# Patient Record
Sex: Female | Born: 1970 | Race: White | Hispanic: No | Marital: Married | Smoking: Never smoker
Health system: Southern US, Community
[De-identification: ages and names within clinical notes are randomized; demographics above are authoritative.]

## PROBLEM LIST (undated history)

## (undated) DIAGNOSIS — B009 Herpesviral infection, unspecified: Secondary | ICD-10-CM

## (undated) DIAGNOSIS — E663 Overweight: Secondary | ICD-10-CM

## (undated) DIAGNOSIS — Z86018 Personal history of other benign neoplasm: Secondary | ICD-10-CM

## (undated) DIAGNOSIS — F909 Attention-deficit hyperactivity disorder, unspecified type: Secondary | ICD-10-CM

## (undated) DIAGNOSIS — R030 Elevated blood-pressure reading, without diagnosis of hypertension: Secondary | ICD-10-CM

## (undated) HISTORY — PX: OTHER SURGICAL HISTORY: SHX169

## (undated) HISTORY — DX: Personal history of other benign neoplasm: Z86.018

## (undated) HISTORY — DX: Overweight: E66.3

## (undated) HISTORY — DX: Herpesviral infection, unspecified: B00.9

## (undated) HISTORY — PX: ABLATION: SHX5711

## (undated) HISTORY — DX: Elevated blood-pressure reading, without diagnosis of hypertension: R03.0

## (undated) HISTORY — DX: Attention-deficit hyperactivity disorder, unspecified type: F90.9

---

## 1998-08-02 ENCOUNTER — Other Ambulatory Visit: Admission: RE | Admit: 1998-08-02 | Discharge: 1998-08-02 | Payer: Self-pay | Admitting: Obstetrics and Gynecology

## 1999-02-22 ENCOUNTER — Other Ambulatory Visit: Admission: RE | Admit: 1999-02-22 | Discharge: 1999-02-22 | Payer: Self-pay | Admitting: Obstetrics and Gynecology

## 2000-02-26 ENCOUNTER — Other Ambulatory Visit: Admission: RE | Admit: 2000-02-26 | Discharge: 2000-02-26 | Payer: Self-pay | Admitting: Obstetrics and Gynecology

## 2000-10-10 ENCOUNTER — Inpatient Hospital Stay (HOSPITAL_COMMUNITY): Admission: AD | Admit: 2000-10-10 | Discharge: 2000-10-27 | Payer: Self-pay | Admitting: Obstetrics & Gynecology

## 2000-10-11 ENCOUNTER — Encounter: Payer: Self-pay | Admitting: Obstetrics and Gynecology

## 2000-10-12 ENCOUNTER — Encounter: Payer: Self-pay | Admitting: Obstetrics and Gynecology

## 2000-10-13 ENCOUNTER — Encounter: Payer: Self-pay | Admitting: Obstetrics and Gynecology

## 2000-10-14 ENCOUNTER — Encounter: Payer: Self-pay | Admitting: Obstetrics and Gynecology

## 2000-10-15 ENCOUNTER — Encounter: Payer: Self-pay | Admitting: Obstetrics and Gynecology

## 2000-10-16 ENCOUNTER — Encounter: Payer: Self-pay | Admitting: Obstetrics and Gynecology

## 2000-10-17 ENCOUNTER — Encounter: Payer: Self-pay | Admitting: Obstetrics and Gynecology

## 2000-10-18 ENCOUNTER — Encounter: Payer: Self-pay | Admitting: Obstetrics and Gynecology

## 2000-10-19 ENCOUNTER — Encounter: Payer: Self-pay | Admitting: Obstetrics and Gynecology

## 2000-10-20 ENCOUNTER — Encounter: Payer: Self-pay | Admitting: Obstetrics and Gynecology

## 2000-10-21 ENCOUNTER — Encounter: Payer: Self-pay | Admitting: *Deleted

## 2000-10-22 ENCOUNTER — Encounter: Payer: Self-pay | Admitting: Obstetrics and Gynecology

## 2000-10-23 ENCOUNTER — Encounter: Payer: Self-pay | Admitting: Obstetrics and Gynecology

## 2000-10-28 ENCOUNTER — Encounter: Admission: RE | Admit: 2000-10-28 | Discharge: 2000-11-27 | Payer: Self-pay | Admitting: Obstetrics and Gynecology

## 2000-11-25 ENCOUNTER — Other Ambulatory Visit: Admission: RE | Admit: 2000-11-25 | Discharge: 2000-11-25 | Payer: Self-pay | Admitting: Obstetrics and Gynecology

## 2001-12-02 ENCOUNTER — Other Ambulatory Visit: Admission: RE | Admit: 2001-12-02 | Discharge: 2001-12-02 | Payer: Self-pay | Admitting: Obstetrics and Gynecology

## 2001-12-21 ENCOUNTER — Ambulatory Visit (HOSPITAL_COMMUNITY): Admission: AD | Admit: 2001-12-21 | Discharge: 2001-12-21 | Payer: Self-pay | Admitting: Obstetrics and Gynecology

## 2001-12-21 ENCOUNTER — Encounter: Payer: Self-pay | Admitting: Obstetrics and Gynecology

## 2001-12-23 ENCOUNTER — Inpatient Hospital Stay (HOSPITAL_COMMUNITY): Admission: AD | Admit: 2001-12-23 | Discharge: 2001-12-23 | Payer: Self-pay | Admitting: Obstetrics & Gynecology

## 2002-09-01 ENCOUNTER — Other Ambulatory Visit: Admission: RE | Admit: 2002-09-01 | Discharge: 2002-09-01 | Payer: Self-pay | Admitting: Obstetrics and Gynecology

## 2003-03-19 ENCOUNTER — Inpatient Hospital Stay (HOSPITAL_COMMUNITY): Admission: AD | Admit: 2003-03-19 | Discharge: 2003-03-19 | Payer: Self-pay | Admitting: Obstetrics and Gynecology

## 2003-03-21 ENCOUNTER — Inpatient Hospital Stay (HOSPITAL_COMMUNITY): Admission: AD | Admit: 2003-03-21 | Discharge: 2003-03-21 | Payer: Self-pay | Admitting: Obstetrics & Gynecology

## 2003-03-23 ENCOUNTER — Inpatient Hospital Stay (HOSPITAL_COMMUNITY): Admission: RE | Admit: 2003-03-23 | Discharge: 2003-03-26 | Payer: Self-pay | Admitting: Obstetrics and Gynecology

## 2003-04-21 ENCOUNTER — Other Ambulatory Visit: Admission: RE | Admit: 2003-04-21 | Discharge: 2003-04-21 | Payer: Self-pay | Admitting: Obstetrics and Gynecology

## 2004-03-15 ENCOUNTER — Inpatient Hospital Stay (HOSPITAL_COMMUNITY): Admission: RE | Admit: 2004-03-15 | Discharge: 2004-03-18 | Payer: Self-pay | Admitting: Obstetrics and Gynecology

## 2004-03-27 ENCOUNTER — Inpatient Hospital Stay (HOSPITAL_COMMUNITY): Admission: AD | Admit: 2004-03-27 | Discharge: 2004-03-28 | Payer: Self-pay | Admitting: Obstetrics & Gynecology

## 2004-05-17 DIAGNOSIS — Z86018 Personal history of other benign neoplasm: Secondary | ICD-10-CM

## 2004-05-17 HISTORY — DX: Personal history of other benign neoplasm: Z86.018

## 2005-11-09 ENCOUNTER — Emergency Department: Payer: Self-pay

## 2006-08-27 ENCOUNTER — Ambulatory Visit (HOSPITAL_COMMUNITY): Admission: RE | Admit: 2006-08-27 | Discharge: 2006-08-27 | Payer: Self-pay | Admitting: Obstetrics and Gynecology

## 2008-06-02 ENCOUNTER — Ambulatory Visit: Payer: Self-pay | Admitting: General Practice

## 2010-07-28 ENCOUNTER — Other Ambulatory Visit: Payer: Self-pay | Admitting: Obstetrics and Gynecology

## 2011-06-20 ENCOUNTER — Ambulatory Visit: Payer: Self-pay | Admitting: Obstetrics and Gynecology

## 2012-06-25 ENCOUNTER — Ambulatory Visit: Payer: Self-pay | Admitting: General Practice

## 2012-07-07 ENCOUNTER — Ambulatory Visit: Payer: Self-pay | Admitting: General Practice

## 2013-03-03 ENCOUNTER — Encounter: Payer: Self-pay | Admitting: Podiatry

## 2013-03-03 ENCOUNTER — Ambulatory Visit (INDEPENDENT_AMBULATORY_CARE_PROVIDER_SITE_OTHER): Payer: BC Managed Care – PPO

## 2013-03-03 ENCOUNTER — Ambulatory Visit (INDEPENDENT_AMBULATORY_CARE_PROVIDER_SITE_OTHER): Payer: BC Managed Care – PPO | Admitting: Podiatry

## 2013-03-03 VITALS — BP 113/75 | HR 86 | Resp 16 | Ht 62.0 in | Wt 172.0 lb

## 2013-03-03 DIAGNOSIS — M79672 Pain in left foot: Secondary | ICD-10-CM

## 2013-03-03 DIAGNOSIS — M79609 Pain in unspecified limb: Secondary | ICD-10-CM

## 2013-03-03 DIAGNOSIS — M722 Plantar fascial fibromatosis: Secondary | ICD-10-CM

## 2013-03-03 MED ORDER — MELOXICAM 15 MG PO TABS: 15.0000 mg | ORAL_TABLET | Freq: Every day | ORAL | Status: DC

## 2013-03-03 MED ORDER — TRIAMCINOLONE ACETONIDE 10 MG/ML IJ SUSP
5.0000 mg | Freq: Once | INTRAMUSCULAR | Status: AC
  Administered 2013-03-03: 5 mg via INTRA_ARTICULAR

## 2013-03-03 NOTE — Progress Notes (Signed)
Subjective:     Patient ID: Renee Ayala, female   DOB: [birth year], 42 y.o.   MRN: [id number]  Foot Pain   patient states she is having trouble with both of her heels the right being more than the left and it seems to be more prevalent on weekends then during the week. States it has been present for several months and gradually is becoming more intense   Review of Systems  All other systems reviewed and are negative.       Objective:   Physical Exam  Nursing note and vitals reviewed. Constitutional: She is oriented to person, place, and time.  Cardiovascular: Intact distal pulses.   Musculoskeletal: Normal range of motion.  Neurological: She is oriented to person, place, and time.  Skin: Skin is warm.   neurovascular status was intact with normal muscle strength and no equinus condition noted. Pain in the plantar heel right over left with inflammation noted upon evaluation     Assessment:     Probable plantar fasciitis right over left heel    Plan:     H&P and x-rays reviewed with patient. Injected the plantar fascia right 3 mg Kenalog 5 mg Xylocaine Marcaine mixture and dispensed fascial brace with instructions on usage. Place patient on Mobic 15 mg daily for 30 days

## 2013-03-03 NOTE — Progress Notes (Signed)
N PAIN L B/L HEEL RIGHT IS WORSE D 2-53M O SLOWLY C WORSE A FLAT SHOES T 0

## 2013-03-20 ENCOUNTER — Ambulatory Visit (INDEPENDENT_AMBULATORY_CARE_PROVIDER_SITE_OTHER): Payer: BC Managed Care – PPO | Admitting: Podiatry

## 2013-03-20 ENCOUNTER — Encounter: Payer: Self-pay | Admitting: Podiatry

## 2013-03-20 VITALS — BP 183/168 | HR 92 | Resp 16 | Ht 62.0 in | Wt 170.0 lb

## 2013-03-20 DIAGNOSIS — M722 Plantar fascial fibromatosis: Secondary | ICD-10-CM

## 2013-03-21 NOTE — Progress Notes (Signed)
Subjective:     Patient ID: Renee Ayala, female   DOB: 1971/02/05, 42 y.o.   MRN: [id number]  HPI patient states my heel is feeling much better with mild pain if I do a lot of walking   Review of Systems     Objective:   Physical Exam Neurovascular status intact with no changes and pain to palpation medial calcaneal tubercle when pressed deeply    Assessment:     Improved plantar fasciitis right with mild pain still noted    Plan:     Reviewed physical therapy supportive shoe gear usage and stretching exercises. I am discharging and less symptoms were to recur where we will consider orthotics or splinting

## 2013-07-02 ENCOUNTER — Ambulatory Visit: Payer: Self-pay | Admitting: General Practice

## 2014-07-06 ENCOUNTER — Ambulatory Visit: Payer: Self-pay | Admitting: General Practice

## 2015-06-16 ENCOUNTER — Other Ambulatory Visit: Payer: Self-pay | Admitting: Family Medicine

## 2015-06-16 DIAGNOSIS — Z1231 Encounter for screening mammogram for malignant neoplasm of breast: Secondary | ICD-10-CM

## 2015-07-13 ENCOUNTER — Ambulatory Visit
Admission: RE | Admit: 2015-07-13 | Discharge: 2015-07-13 | Disposition: A | Discharge status: Home / Self Care | Payer: BLUE CROSS/BLUE SHIELD | Source: Ambulatory Visit | Attending: Family Medicine | Admitting: Family Medicine

## 2015-07-13 DIAGNOSIS — Z1231 Encounter for screening mammogram for malignant neoplasm of breast: Secondary | ICD-10-CM | POA: Diagnosis present

## 2016-01-17 DIAGNOSIS — Z1283 Encounter for screening for malignant neoplasm of skin: Secondary | ICD-10-CM | POA: Diagnosis not present

## 2016-01-17 DIAGNOSIS — D485 Neoplasm of uncertain behavior of skin: Secondary | ICD-10-CM | POA: Diagnosis not present

## 2016-01-17 DIAGNOSIS — L718 Other rosacea: Secondary | ICD-10-CM | POA: Diagnosis not present

## 2016-01-17 DIAGNOSIS — D229 Melanocytic nevi, unspecified: Secondary | ICD-10-CM | POA: Diagnosis not present

## 2016-01-26 DIAGNOSIS — M180 Bilateral primary osteoarthritis of first carpometacarpal joints: Secondary | ICD-10-CM | POA: Diagnosis not present

## 2016-05-22 DIAGNOSIS — M531 Cervicobrachial syndrome: Secondary | ICD-10-CM | POA: Diagnosis not present

## 2016-05-22 DIAGNOSIS — M7531 Calcific tendinitis of right shoulder: Secondary | ICD-10-CM | POA: Diagnosis not present

## 2016-05-22 DIAGNOSIS — M65831 Other synovitis and tenosynovitis, right forearm: Secondary | ICD-10-CM | POA: Diagnosis not present

## 2016-05-22 DIAGNOSIS — M25531 Pain in right wrist: Secondary | ICD-10-CM | POA: Diagnosis not present

## 2016-05-28 DIAGNOSIS — M65831 Other synovitis and tenosynovitis, right forearm: Secondary | ICD-10-CM | POA: Diagnosis not present

## 2016-05-28 DIAGNOSIS — M531 Cervicobrachial syndrome: Secondary | ICD-10-CM | POA: Diagnosis not present

## 2016-05-28 DIAGNOSIS — M7531 Calcific tendinitis of right shoulder: Secondary | ICD-10-CM | POA: Diagnosis not present

## 2016-05-28 DIAGNOSIS — M25531 Pain in right wrist: Secondary | ICD-10-CM | POA: Diagnosis not present

## 2016-06-05 DIAGNOSIS — M65831 Other synovitis and tenosynovitis, right forearm: Secondary | ICD-10-CM | POA: Diagnosis not present

## 2016-06-05 DIAGNOSIS — M25531 Pain in right wrist: Secondary | ICD-10-CM | POA: Diagnosis not present

## 2016-06-05 DIAGNOSIS — M531 Cervicobrachial syndrome: Secondary | ICD-10-CM | POA: Diagnosis not present

## 2016-06-05 DIAGNOSIS — M7531 Calcific tendinitis of right shoulder: Secondary | ICD-10-CM | POA: Diagnosis not present

## 2016-06-08 DIAGNOSIS — M7531 Calcific tendinitis of right shoulder: Secondary | ICD-10-CM | POA: Diagnosis not present

## 2016-06-08 DIAGNOSIS — M65831 Other synovitis and tenosynovitis, right forearm: Secondary | ICD-10-CM | POA: Diagnosis not present

## 2016-06-08 DIAGNOSIS — M531 Cervicobrachial syndrome: Secondary | ICD-10-CM | POA: Diagnosis not present

## 2016-06-08 DIAGNOSIS — M25531 Pain in right wrist: Secondary | ICD-10-CM | POA: Diagnosis not present

## 2016-06-13 ENCOUNTER — Other Ambulatory Visit: Payer: Self-pay | Admitting: Family Medicine

## 2016-06-13 DIAGNOSIS — M65831 Other synovitis and tenosynovitis, right forearm: Secondary | ICD-10-CM | POA: Diagnosis not present

## 2016-06-13 DIAGNOSIS — M531 Cervicobrachial syndrome: Secondary | ICD-10-CM | POA: Diagnosis not present

## 2016-06-13 DIAGNOSIS — M7531 Calcific tendinitis of right shoulder: Secondary | ICD-10-CM | POA: Diagnosis not present

## 2016-06-13 DIAGNOSIS — Z1231 Encounter for screening mammogram for malignant neoplasm of breast: Secondary | ICD-10-CM

## 2016-06-13 DIAGNOSIS — M25531 Pain in right wrist: Secondary | ICD-10-CM | POA: Diagnosis not present

## 2016-07-17 ENCOUNTER — Ambulatory Visit
Admission: RE | Admit: 2016-07-17 | Discharge: 2016-07-17 | Disposition: A | Discharge status: Home / Self Care | Payer: BLUE CROSS/BLUE SHIELD | Source: Ambulatory Visit | Attending: Family Medicine | Admitting: Family Medicine

## 2016-07-17 DIAGNOSIS — Z1231 Encounter for screening mammogram for malignant neoplasm of breast: Secondary | ICD-10-CM | POA: Insufficient documentation

## 2016-11-14 DIAGNOSIS — M531 Cervicobrachial syndrome: Secondary | ICD-10-CM | POA: Diagnosis not present

## 2016-11-14 DIAGNOSIS — M65831 Other synovitis and tenosynovitis, right forearm: Secondary | ICD-10-CM | POA: Diagnosis not present

## 2016-11-14 DIAGNOSIS — M25531 Pain in right wrist: Secondary | ICD-10-CM | POA: Diagnosis not present

## 2016-11-14 DIAGNOSIS — M7531 Calcific tendinitis of right shoulder: Secondary | ICD-10-CM | POA: Diagnosis not present

## 2017-01-21 DIAGNOSIS — M531 Cervicobrachial syndrome: Secondary | ICD-10-CM | POA: Diagnosis not present

## 2017-01-21 DIAGNOSIS — M7531 Calcific tendinitis of right shoulder: Secondary | ICD-10-CM | POA: Diagnosis not present

## 2017-01-21 DIAGNOSIS — M25531 Pain in right wrist: Secondary | ICD-10-CM | POA: Diagnosis not present

## 2017-01-21 DIAGNOSIS — M65831 Other synovitis and tenosynovitis, right forearm: Secondary | ICD-10-CM | POA: Diagnosis not present

## 2017-01-25 DIAGNOSIS — D229 Melanocytic nevi, unspecified: Secondary | ICD-10-CM | POA: Diagnosis not present

## 2017-01-25 DIAGNOSIS — M531 Cervicobrachial syndrome: Secondary | ICD-10-CM | POA: Diagnosis not present

## 2017-01-25 DIAGNOSIS — M25531 Pain in right wrist: Secondary | ICD-10-CM | POA: Diagnosis not present

## 2017-01-25 DIAGNOSIS — L739 Follicular disorder, unspecified: Secondary | ICD-10-CM | POA: Diagnosis not present

## 2017-01-25 DIAGNOSIS — M65831 Other synovitis and tenosynovitis, right forearm: Secondary | ICD-10-CM | POA: Diagnosis not present

## 2017-01-25 DIAGNOSIS — D485 Neoplasm of uncertain behavior of skin: Secondary | ICD-10-CM | POA: Diagnosis not present

## 2017-01-25 DIAGNOSIS — M7531 Calcific tendinitis of right shoulder: Secondary | ICD-10-CM | POA: Diagnosis not present

## 2017-01-25 DIAGNOSIS — D2261 Melanocytic nevi of right upper limb, including shoulder: Secondary | ICD-10-CM | POA: Diagnosis not present

## 2017-01-25 DIAGNOSIS — L308 Other specified dermatitis: Secondary | ICD-10-CM | POA: Diagnosis not present

## 2017-01-25 DIAGNOSIS — Z1283 Encounter for screening for malignant neoplasm of skin: Secondary | ICD-10-CM | POA: Diagnosis not present

## 2017-04-01 DIAGNOSIS — M9901 Segmental and somatic dysfunction of cervical region: Secondary | ICD-10-CM | POA: Diagnosis not present

## 2017-04-01 DIAGNOSIS — M531 Cervicobrachial syndrome: Secondary | ICD-10-CM | POA: Diagnosis not present

## 2017-04-01 DIAGNOSIS — M9908 Segmental and somatic dysfunction of rib cage: Secondary | ICD-10-CM | POA: Diagnosis not present

## 2017-04-01 DIAGNOSIS — M9902 Segmental and somatic dysfunction of thoracic region: Secondary | ICD-10-CM | POA: Diagnosis not present

## 2017-05-29 DIAGNOSIS — H5213 Myopia, bilateral: Secondary | ICD-10-CM | POA: Diagnosis not present

## 2017-07-04 ENCOUNTER — Other Ambulatory Visit: Payer: Self-pay | Admitting: Family Medicine

## 2017-07-04 DIAGNOSIS — Z1231 Encounter for screening mammogram for malignant neoplasm of breast: Secondary | ICD-10-CM

## 2017-07-24 ENCOUNTER — Ambulatory Visit
Admission: RE | Admit: 2017-07-24 | Discharge: 2017-07-24 | Disposition: A | Discharge status: Home / Self Care | Payer: BLUE CROSS/BLUE SHIELD | Source: Ambulatory Visit | Attending: Family Medicine | Admitting: Family Medicine

## 2017-07-24 DIAGNOSIS — Z1231 Encounter for screening mammogram for malignant neoplasm of breast: Secondary | ICD-10-CM | POA: Insufficient documentation

## 2017-07-24 LAB — HM MAMMOGRAPHY

## 2017-07-30 LAB — LIPID PANEL
Cholesterol: 152 (ref 0–200)
HDL: 56 (ref 35–70)
LDL Cholesterol: 85
Triglycerides: 55 (ref 40–160)

## 2017-09-24 DIAGNOSIS — Z1151 Encounter for screening for human papillomavirus (HPV): Secondary | ICD-10-CM | POA: Diagnosis not present

## 2017-09-24 DIAGNOSIS — Z01419 Encounter for gynecological examination (general) (routine) without abnormal findings: Secondary | ICD-10-CM | POA: Diagnosis not present

## 2017-09-24 DIAGNOSIS — Z6831 Body mass index (BMI) 31.0-31.9, adult: Secondary | ICD-10-CM | POA: Diagnosis not present

## 2018-01-28 DIAGNOSIS — D485 Neoplasm of uncertain behavior of skin: Secondary | ICD-10-CM | POA: Diagnosis not present

## 2018-01-28 DIAGNOSIS — L812 Freckles: Secondary | ICD-10-CM | POA: Diagnosis not present

## 2018-01-28 DIAGNOSIS — Z1283 Encounter for screening for malignant neoplasm of skin: Secondary | ICD-10-CM | POA: Diagnosis not present

## 2018-01-28 DIAGNOSIS — L578 Other skin changes due to chronic exposure to nonionizing radiation: Secondary | ICD-10-CM | POA: Diagnosis not present

## 2018-01-28 DIAGNOSIS — D229 Melanocytic nevi, unspecified: Secondary | ICD-10-CM | POA: Diagnosis not present

## 2018-01-28 DIAGNOSIS — L111 Transient acantholytic dermatosis [Grover]: Secondary | ICD-10-CM | POA: Diagnosis not present

## 2018-10-02 ENCOUNTER — Other Ambulatory Visit: Payer: Self-pay

## 2018-10-02 ENCOUNTER — Ambulatory Visit: Payer: Self-pay | Admitting: Internal Medicine

## 2018-10-02 ENCOUNTER — Encounter: Payer: Self-pay | Admitting: Internal Medicine

## 2018-10-02 VITALS — BP 128/74 | HR 85 | Temp 98.3°F | Resp 16 | Ht 62.0 in | Wt 182.0 lb

## 2018-10-02 DIAGNOSIS — F909 Attention-deficit hyperactivity disorder, unspecified type: Secondary | ICD-10-CM

## 2018-10-02 MED ORDER — AMPHETAMINE-DEXTROAMPHETAMINE 20 MG PO TABS: 20.0000 mg | ORAL_TABLET | Freq: Two times a day (BID) | ORAL | 0 refills | Status: DC

## 2018-10-02 NOTE — Progress Notes (Signed)
S - Presents for renewal of ADHD medicine No SE concerns with no CP/palpitations/heart racing, no insomnia,  Still notes is helpful and she has tried to take it once daily, often on a weekend day if tries, but not all that frequently.   Current Outpatient Medications on File Prior to Visit  Medication Sig Dispense Refill  . oxybutynin (DITROPAN) 5 MG tablet Take 5 mg by mouth daily.    . valACYclovir (VALTREX) 500 MG tablet Take 500 mg by mouth daily.     No current facility-administered medications on file prior to visit.     Meds reviewed  No Known Allergies   O - NAD, masked  BP 128/74 (BP Location: Right Arm, Patient Position: Sitting, Cuff Size: Large)   Pulse 85   Temp 98.3 F (36.8 C) (Oral)   Resp 16   Ht 5\' 2"  (1.575 m)   Wt 182 lb (82.6 kg)   SpO2 96%   BMI 33.29 kg/m    Neck - carotids without bruits Car - RRR without m/g/r Pulm - CTA Ext - no LE edema Neuro - Affect not flat, approp with conversation, speech is not rapid  Ass - ADHD - tolerating stimulant medicine to help   Plan - Has signed [place] P[place] reviewed, printed out  Renewed medicine and aware cannot put refills on prescription Days away again encouraged and periodic days without medicine can be beneficial with the less amount of this medicine needed to help the better  Follow-up in person visits needed every three months, with refills possible through EHR between, should contact the office when running low on the medicine if not contacted by the pharmacy to help generate this refill (not wait until only have a day or two of the medicine left to contact recommended), and prn otherwise

## 2018-11-11 ENCOUNTER — Other Ambulatory Visit: Payer: Self-pay | Admitting: Internal Medicine

## 2018-12-15 ENCOUNTER — Other Ambulatory Visit: Payer: Self-pay | Admitting: Internal Medicine

## 2019-01-08 ENCOUNTER — Encounter: Payer: Self-pay | Admitting: Internal Medicine

## 2019-01-08 ENCOUNTER — Ambulatory Visit: Payer: 59 | Admitting: Physician Assistant

## 2019-01-08 ENCOUNTER — Other Ambulatory Visit: Payer: Self-pay

## 2019-01-08 VITALS — BP 123/77 | HR 86 | Temp 98.7°F | Resp 12 | Ht 63.0 in | Wt 186.0 lb

## 2019-01-08 DIAGNOSIS — N390 Urinary tract infection, site not specified: Secondary | ICD-10-CM

## 2019-01-08 DIAGNOSIS — N3001 Acute cystitis with hematuria: Secondary | ICD-10-CM

## 2019-01-08 LAB — POCT URINALYSIS DIPSTICK
Bilirubin, UA: NEGATIVE
Glucose, UA: NEGATIVE
Ketones, UA: NEGATIVE
Nitrite, UA: POSITIVE
Protein, UA: POSITIVE — AB
Spec Grav, UA: >=1.03 — AB (ref 1.010–1.025)
Urobilinogen, UA: 0.2 E.U./dL
pH, UA: 5.5 (ref 5.0–8.0)

## 2019-01-08 MED ORDER — NITROFURANTOIN MACROCRYSTAL 100 MG PO CAPS: 100.0000 mg | ORAL_CAPSULE | Freq: Two times a day (BID) | ORAL | 0 refills | Status: AC

## 2019-01-08 NOTE — Progress Notes (Signed)
Burning, increased frequency of feeling lik need to urinate - symptoms started last night. Denies pelvic pain/pressure or low back pain.

## 2019-01-08 NOTE — Patient Instructions (Signed)

## 2019-01-08 NOTE — Progress Notes (Signed)
[id number], Renee Frost, MD Chief Complaint  Patient presents with  . Urinary Tract Infection    Presents C/O UTI symptoms  . COVID Screening    Negative    Current Issues:  Presents with1 days of dysuria,frequency Associated symptoms include:  Denies fever, N,V,D, back pain, abdominal pain other than bladder pressure As above History of similar symptoms. One previous experience years ago  Sexually active: yes ; one partner   Denies concern for STI   Prior to Admission medications   Medication Sig Start Date End Date Taking? Authorizing Provider  amphetamine-dextroamphetamine (ADDERALL) 20 MG tablet TAKE ONE TABLET BY MOUTH ONCE TO TWICE DAILY 12/16/18  Yes [name] D, MD  oxybutynin (DITROPAN) 5 MG tablet Take 5 mg by mouth daily.   Yes [provider]  valACYclovir (VALTREX) 500 MG tablet Take 500 mg by mouth daily.   Yes [provider]  oxybutynin (DITROPAN-XL) 5 MG 24 hr tablet  01/06/19   [provider]    Review of Systems:neg X per HPI  PE:  BP 123/77 (BP Location: Right Arm, Patient Position: Sitting, Cuff Size: Large)   Pulse 86   Temp 98.7 F (37.1 C) (Oral)   Resp 12   Ht 5\' 3"  (1.6 m)   Wt 186 lb (84.4 kg)   SpO2 97%   BMI 32.95 kg/m  Constitutional WDWN NAD afebrile c/o freq void w discomfort x 1 day - VS noted Heart RSR  Lungs CtA Back noCVAT Abdomen soft, NT , no mass Pelvic exam deferrred  Results for orders placed or performed in visit on 01/08/19  POCT urinalysis dipstick  Result Value Ref Range   Color, UA Amber    Clarity, UA Cloudy    Glucose, UA Negative Negative   Bilirubin, UA Negative    Ketones, UA Negative    Spec Grav, UA >=1.030 (A) 1.010 - 1.025   Blood, UA 3+    pH, UA 5.5 5.0 - 8.0   Protein, UA Positive (A) Negative   Urobilinogen, UA 0.2 0.2 or 1.0 E.U./dL   Nitrite, UA Positive    Leukocytes, UA Small (1+) (A) Negative   Appearance     Odor      Assessment and Plan:  1.  Urinary tract infection with hematuria, site unspecified - POCT urinalysis dipstick  Urine culture to be submitted

## 2019-01-11 LAB — URINE CULTURE

## 2019-01-12 DIAGNOSIS — M436 Torticollis: Secondary | ICD-10-CM | POA: Diagnosis not present

## 2019-01-12 DIAGNOSIS — M531 Cervicobrachial syndrome: Secondary | ICD-10-CM | POA: Diagnosis not present

## 2019-01-12 DIAGNOSIS — M9901 Segmental and somatic dysfunction of cervical region: Secondary | ICD-10-CM | POA: Diagnosis not present

## 2019-01-15 ENCOUNTER — Other Ambulatory Visit: Payer: Self-pay | Admitting: Internal Medicine

## 2019-02-03 DIAGNOSIS — L821 Other seborrheic keratosis: Secondary | ICD-10-CM | POA: Diagnosis not present

## 2019-02-03 DIAGNOSIS — L814 Other melanin hyperpigmentation: Secondary | ICD-10-CM | POA: Diagnosis not present

## 2019-02-03 DIAGNOSIS — Z1283 Encounter for screening for malignant neoplasm of skin: Secondary | ICD-10-CM | POA: Diagnosis not present

## 2019-02-03 DIAGNOSIS — L300 Nummular dermatitis: Secondary | ICD-10-CM | POA: Diagnosis not present

## 2019-02-03 DIAGNOSIS — D2271 Melanocytic nevi of right lower limb, including hip: Secondary | ICD-10-CM | POA: Diagnosis not present

## 2019-02-03 DIAGNOSIS — L738 Other specified follicular disorders: Secondary | ICD-10-CM | POA: Diagnosis not present

## 2019-02-03 DIAGNOSIS — D225 Melanocytic nevi of trunk: Secondary | ICD-10-CM | POA: Diagnosis not present

## 2019-02-03 DIAGNOSIS — L578 Other skin changes due to chronic exposure to nonionizing radiation: Secondary | ICD-10-CM | POA: Diagnosis not present

## 2019-02-03 DIAGNOSIS — D2272 Melanocytic nevi of left lower limb, including hip: Secondary | ICD-10-CM | POA: Diagnosis not present

## 2019-02-23 ENCOUNTER — Ambulatory Visit: Payer: 59 | Admitting: Physician Assistant

## 2019-02-23 ENCOUNTER — Encounter: Payer: Self-pay | Admitting: Physician Assistant

## 2019-02-23 ENCOUNTER — Other Ambulatory Visit: Payer: Self-pay

## 2019-02-23 VITALS — BP 124/84 | HR 78 | Temp 98.6°F | Resp 16 | Ht 62.0 in | Wt 188.0 lb

## 2019-02-23 DIAGNOSIS — F901 Attention-deficit hyperactivity disorder, predominantly hyperactive type: Secondary | ICD-10-CM

## 2019-02-23 DIAGNOSIS — Z760 Encounter for issue of repeat prescription: Secondary | ICD-10-CM

## 2019-02-23 MED ORDER — AMPHETAMINE-DEXTROAMPHETAMINE 20 MG PO TABS: 20.0000 mg | ORAL_TABLET | Freq: Two times a day (BID) | ORAL | 0 refills | Status: DC

## 2019-02-23 NOTE — Progress Notes (Signed)
   Subjective:medication refill    Patient ID: Renee Ayala, female    DOB: [birth year], 48 y.o.   MRN: [id number]  HPI Patient request refill of Adderall for ADHD. Voice no compliant .   Review of Systems Hypertension    Objective:   Physical Exam Deferred       Assessment & Plan:Medication refill for ADHD  Discuss usage and advise monthly re-evaluation.

## 2019-02-25 ENCOUNTER — Ambulatory Visit: Payer: 59

## 2019-03-24 ENCOUNTER — Other Ambulatory Visit: Payer: Self-pay

## 2019-03-24 DIAGNOSIS — F909 Attention-deficit hyperactivity disorder, unspecified type: Secondary | ICD-10-CM

## 2019-03-24 MED ORDER — AMPHETAMINE-DEXTROAMPHETAMINE 20 MG PO TABS: 20.0000 mg | ORAL_TABLET | Freq: Two times a day (BID) | ORAL | 0 refills | Status: DC

## 2019-04-14 ENCOUNTER — Other Ambulatory Visit: Payer: Self-pay

## 2019-04-14 DIAGNOSIS — N3281 Overactive bladder: Secondary | ICD-10-CM

## 2019-04-14 MED ORDER — OXYBUTYNIN CHLORIDE ER 5 MG PO TB24: 5.0000 mg | ORAL_TABLET | Freq: Every day | ORAL | 1 refills | Status: DC

## 2019-04-23 ENCOUNTER — Other Ambulatory Visit: Payer: Self-pay

## 2019-04-23 ENCOUNTER — Other Ambulatory Visit: Payer: Self-pay | Admitting: Physician Assistant

## 2019-04-23 DIAGNOSIS — F909 Attention-deficit hyperactivity disorder, unspecified type: Secondary | ICD-10-CM

## 2019-04-23 MED ORDER — AMPHETAMINE-DEXTROAMPHETAMINE 20 MG PO TABS
20.0000 mg | ORAL_TABLET | Freq: Two times a day (BID) | ORAL | 0 refills | Status: DC
Start: 2019-04-23 — End: 2019-05-26

## 2019-04-23 NOTE — Progress Notes (Signed)
Patient seen in November for Rx review and was doing well. Interim refills x 2 per Dr Roxan Hockey are permitted Refill  Done

## 2019-05-26 ENCOUNTER — Ambulatory Visit: Payer: 59 | Admitting: Occupational Medicine

## 2019-05-26 ENCOUNTER — Other Ambulatory Visit: Payer: Self-pay

## 2019-05-26 DIAGNOSIS — F909 Attention-deficit hyperactivity disorder, unspecified type: Secondary | ICD-10-CM

## 2019-05-26 MED ORDER — AMPHETAMINE-DEXTROAMPHETAMINE 20 MG PO TABS: 20.0000 mg | ORAL_TABLET | Freq: Two times a day (BID) | ORAL | 0 refills | Status: DC

## 2019-05-26 NOTE — Progress Notes (Signed)
  Subjective:     Patient ID: Renee Ayala, female   DOB: 1970-10-09, 49 y.o.   MRN: [id number]  HPI Patient works in [place].  She presents for her quarterly ADD management visit at the [facility].  Says that Adderall 20 mg twice a day is giving her enough focus.  Denies side effects of weight loss, appetite suppression, palpitations, anxiety.  No complaints.    I asked Ms. Dippolito if she was considering vaccination for Covid when it becomes available to her..  She said she was not interested.   Current Outpatient Medications on File Prior to Visit  Medication Sig Dispense Refill  . amphetamine-dextroamphetamine (ADDERALL) 20 MG tablet Take 1 tablet (20 mg total) by mouth 2 (two) times daily. 60 tablet 0  . Halcinonide 0.1 % CREA APPLY A SMALL AMOUNT TO AFFECTED AREA TWICE A DAY AS NEEDED    . oxybutynin (DITROPAN-XL) 5 MG 24 hr tablet Take 1 tablet (5 mg total) by mouth at bedtime. 90 tablet 1  . valACYclovir (VALTREX) 500 MG tablet Take 500 mg by mouth daily.     No current facility-administered medications on file prior to visit.   Review of Systems     Objective:   Physical Exam Today's Vitals   05/26/19 1316  BP: 140/88  Pulse: 84  Temp: 98.3 F (36.8 C)  TempSrc: Oral  SpO2: 99%  Weight: 189 lb 9.6 oz (86 kg)  Height: 5\' 2"  (1.575 m)   Body mass index is 34.68 kg/m.    Assessment:     ADD-stable    Plan:     Refill Adderall 20 mg twice a day #60 with 3 refills.  Follow-up in 3 months for ADD check in.

## 2019-06-29 ENCOUNTER — Other Ambulatory Visit: Payer: Self-pay

## 2019-06-29 DIAGNOSIS — F909 Attention-deficit hyperactivity disorder, unspecified type: Secondary | ICD-10-CM

## 2019-06-29 MED ORDER — AMPHETAMINE-DEXTROAMPHETAMINE 20 MG PO TABS: 20.0000 mg | ORAL_TABLET | Freq: Two times a day (BID) | ORAL | 0 refills | Status: DC

## 2019-08-03 ENCOUNTER — Other Ambulatory Visit: Payer: Self-pay

## 2019-08-03 DIAGNOSIS — F909 Attention-deficit hyperactivity disorder, unspecified type: Secondary | ICD-10-CM

## 2019-08-04 ENCOUNTER — Other Ambulatory Visit: Payer: Self-pay | Admitting: Physician Assistant

## 2019-08-04 DIAGNOSIS — F909 Attention-deficit hyperactivity disorder, unspecified type: Secondary | ICD-10-CM

## 2019-08-04 MED ORDER — AMPHETAMINE-DEXTROAMPHETAMINE 20 MG PO TABS: 20.0000 mg | ORAL_TABLET | Freq: Two times a day (BID) | ORAL | 0 refills | Status: DC

## 2019-08-13 ENCOUNTER — Other Ambulatory Visit: Payer: Self-pay

## 2019-08-13 ENCOUNTER — Ambulatory Visit: Payer: Self-pay

## 2019-08-13 DIAGNOSIS — Z0000 Encounter for general adult medical examination without abnormal findings: Secondary | ICD-10-CM

## 2019-08-13 LAB — POCT URINALYSIS DIPSTICK
Bilirubin, UA: NEGATIVE
Glucose, UA: NEGATIVE
Ketones, UA: NEGATIVE
Leukocytes, UA: NEGATIVE
Nitrite, UA: NEGATIVE
Protein, UA: NEGATIVE
Spec Grav, UA: >=1.03 — AB (ref 1.010–1.025)
Urobilinogen, UA: 0.2 E.U./dL
pH, UA: 5 (ref 5.0–8.0)

## 2019-08-13 NOTE — Progress Notes (Signed)
Scheduled to complete physical 08/19/19.  (Provider TBD)  AMD

## 2019-08-14 LAB — CMP12+LP+TP+TSH+6AC+CBC/D/PLT
ALT: 17 IU/L (ref 0–32)
AST: 20 IU/L (ref 0–40)
Albumin/Globulin Ratio: 1.9 (ref 1.2–2.2)
Albumin: 3.9 g/dL (ref 3.8–4.8)
Alkaline Phosphatase: 72 IU/L (ref 39–117)
BUN/Creatinine Ratio: 18 (ref 9–23)
BUN: 12 mg/dL (ref 6–24)
Basophils Absolute: 0 10*3/uL (ref 0.0–0.2)
Basos: 0 %
Bilirubin Total: 0.5 mg/dL (ref 0.0–1.2)
Calcium: 8.2 mg/dL — ABNORMAL LOW (ref 8.7–10.2)
Chloride: 108 mmol/L — ABNORMAL HIGH (ref 96–106)
Chol/HDL Ratio: 3.1 ratio (ref 0.0–4.4)
Cholesterol, Total: 152 mg/dL (ref 100–199)
Creatinine, Ser: 0.67 mg/dL (ref 0.57–1.00)
EOS (ABSOLUTE): 0.2 10*3/uL (ref 0.0–0.4)
Eos: 3 %
Estimated CHD Risk: <0.5 times avg. (ref 0.0–1.0)
Free Thyroxine Index: 1.6 (ref 1.2–4.9)
GFR calc Af Amer: 119 mL/min/{1.73_m2} (ref >59)
GFR calc non Af Amer: 104 mL/min/{1.73_m2} (ref >59)
GGT: 20 IU/L (ref 0–60)
Globulin, Total: 2.1 g/dL (ref 1.5–4.5)
Glucose: 105 mg/dL — ABNORMAL HIGH (ref 65–99)
HDL: 49 mg/dL (ref >39)
Hematocrit: 42.6 % (ref 34.0–46.6)
Hemoglobin: 14.4 g/dL (ref 11.1–15.9)
Immature Grans (Abs): 0 10*3/uL (ref 0.0–0.1)
Immature Granulocytes: 0 %
Iron: 77 ug/dL (ref 27–159)
LDH: 176 IU/L (ref 119–226)
LDL Chol Calc (NIH): 90 mg/dL (ref 0–99)
Lymphocytes Absolute: 2.6 10*3/uL (ref 0.7–3.1)
Lymphs: 39 %
MCH: 29.6 pg (ref 26.6–33.0)
MCHC: 33.8 g/dL (ref 31.5–35.7)
MCV: 88 fL (ref 79–97)
Monocytes Absolute: 0.5 10*3/uL (ref 0.1–0.9)
Monocytes: 8 %
Neutrophils Absolute: 3.4 10*3/uL (ref 1.4–7.0)
Neutrophils: 50 %
Phosphorus: 3.3 mg/dL (ref 3.0–4.3)
Platelets: 190 10*3/uL (ref 150–450)
Potassium: 4.1 mmol/L (ref 3.5–5.2)
RBC: 4.86 x10E6/uL (ref 3.77–5.28)
RDW: 13.1 % (ref 11.7–15.4)
Sodium: 137 mmol/L (ref 134–144)
T3 Uptake Ratio: 28 % (ref 24–39)
T4, Total: 5.8 ug/dL (ref 4.5–12.0)
TSH: 1.75 u[IU]/mL (ref 0.450–4.500)
Total Protein: 6 g/dL (ref 6.0–8.5)
Triglycerides: 67 mg/dL (ref 0–149)
Uric Acid: 3.5 mg/dL (ref 2.6–6.2)
VLDL Cholesterol Cal: 13 mg/dL (ref 5–40)
WBC: 6.8 10*3/uL (ref 3.4–10.8)

## 2019-08-19 ENCOUNTER — Ambulatory Visit: Payer: 59 | Admitting: Emergency Medicine

## 2019-08-19 ENCOUNTER — Other Ambulatory Visit: Payer: Self-pay

## 2019-08-19 ENCOUNTER — Encounter: Payer: Self-pay | Admitting: Emergency Medicine

## 2019-08-19 VITALS — BP 134/79 | HR 78 | Temp 98.5°F | Resp 16 | Ht 63.0 in | Wt 189.0 lb

## 2019-08-19 DIAGNOSIS — F909 Attention-deficit hyperactivity disorder, unspecified type: Secondary | ICD-10-CM

## 2019-08-19 DIAGNOSIS — Z0000 Encounter for general adult medical examination without abnormal findings: Secondary | ICD-10-CM

## 2019-08-19 MED ORDER — AMPHETAMINE-DEXTROAMPHETAMINE 20 MG PO TABS: 20.0000 mg | ORAL_TABLET | Freq: Two times a day (BID) | ORAL | 0 refills | Status: DC

## 2019-08-19 NOTE — Progress Notes (Signed)
I have reviewed the triage vital signs and the nursing notes.   HISTORY  Chief Complaint Annual Exam    HPI Renee Ayala is a 49 y.o. female is here for an annual physical.  She denies any problems since last year physical.  She is also here for her 99-month review to get her Adderall refilled.  She continues to take 20 mg twice daily.  No other complaints or problems.        Past Medical History:  Diagnosis Date  . ADHD   . Elevated blood pressure reading   . Herpes simplex type 2 infection   . History of dysplastic nevus 05/17/2004   left buttock  . Over weight     Patient Active Problem List   Diagnosis Date Noted  . UTI (urinary tract infection) 01/08/2019    Past Surgical History:  Procedure Laterality Date  . ABLATION    . CESAREAN SECTION     X3  . LIPO SUCTION    . TAMMY TUCK      Prior to Admission medications   Medication Sig Start Date End Date Taking? Authorizing Provider  amphetamine-dextroamphetamine (ADDERALL) 20 MG tablet Take 1 tablet (20 mg total) by mouth 2 (two) times daily. 08/04/19  Yes Letitia Neri L, PA-C  oxybutynin (DITROPAN-XL) 5 MG 24 hr tablet Take 1 tablet (5 mg total) by mouth at bedtime. 04/14/19  Yes Sable Feil, PA-C  Diclofenac Sodium (PENNSAID) 2 % SOLN Pen[place]aid 20 mg/gram/actuation (2 %) topical soln in metered-dose pump  APPLY 2 PUMPS (40 MG) TO THE AFFECTED AREA BY TOPICAL ROUTE 2 TIMES PER DAY    [provider]  Halcinonide 0.1 % CREA APPLY A SMALL AMOUNT TO AFFECTED AREA TWICE A DAY AS NEEDED 02/03/19   [provider]  valACYclovir (VALTREX) 500 MG tablet Take 500 mg by mouth daily.    [provider]    Allergies Patient has no known allergies.  Family History  Problem Relation Age of Onset  . Breast cancer Neg Hx     Social History Social History   Tobacco Use  . Smoking status: Never Smoker  . Smokeless tobacco: Never Used  Substance Use Topics  . Alcohol use: Yes   Comment: socially  . Drug use: No    Review of Systems Constitutional: No fever/chills Eyes: No visual changes. ENT: No complaints. Cardiovascular: Denies chest pain. Respiratory: Denies shortness of breath. Gastrointestinal: No abdominal pain.  No nausea, no vomiting.  No diarrhea.  No constipation. Genitourinary: Negative for dysuria. Musculoskeletal: Negative for muscle aches. Skin: Negative for rash. Neurological: Negative for headaches, focal weakness or numbness. ____________________________________________   PHYSICAL EXAM: Constitutional: Alert and oriented. Well appearing and in no acute distress. Eyes: Conjunctivae are normal. PERRL. EOMI. Head: Atraumatic. Nose: No congestion/rhinnorhea. Neck: No stridor.  No cervical tenderness on palpation posteriorly. Cardiovascular: Normal rate, regular rhythm. Grossly normal heart sounds.  Good peripheral circulation. Respiratory: Normal respiratory effort.  No retractions. Lungs CTAB. Gastrointestinal: Soft and nontender. No distention.  Bowel sounds normoactive x4 quadrants. Musculoskeletal: Nontender thoracic or lumbar spine.  Patient is able move upper and lower extremity same difficulty.  No pitting edema is noted to lower extremities.  Good muscle strength bilaterally.  Normal gait was noted. Neurologic:  Normal speech and language. No gross focal neurologic deficits are appreciated. No gait instability. Skin:  Skin is warm, dry and intact. No rash noted. Psychiatric: Mood and affect are normal. Speech and behavior are normal.  ____________________________________________   LABS (all labs ordered are listed, but only abnormal results are displayed)  Labs were reviewed with patient. ____________________________________________  EKG  Sinus rhythm with a ventricular rate of 80  ____________________________________________    FINAL CLINICAL IMPRESSION(S) / ED DIAGNOSES  Normal annual physical exam. Refill for  Adderall for treatment of ADHD    ED Discharge Orders         Ordered    EKG 12-Lead     08/19/19 1318           Note:  This document was prepared using Dragon voice recognition software and may include unintentional dictation errors.

## 2019-08-19 NOTE — Progress Notes (Signed)
This is also her 3 month visit for Adderall Rx refill.  AMD

## 2019-08-25 DIAGNOSIS — M180 Bilateral primary osteoarthritis of first carpometacarpal joints: Secondary | ICD-10-CM | POA: Diagnosis not present

## 2019-08-31 ENCOUNTER — Other Ambulatory Visit: Payer: Self-pay | Admitting: Emergency Medicine

## 2019-08-31 DIAGNOSIS — Z1231 Encounter for screening mammogram for malignant neoplasm of breast: Secondary | ICD-10-CM

## 2019-09-02 ENCOUNTER — Ambulatory Visit
Admission: RE | Admit: 2019-09-02 | Discharge: 2019-09-02 | Disposition: A | Discharge status: Home / Self Care | Payer: 59 | Source: Ambulatory Visit | Attending: Emergency Medicine | Admitting: Emergency Medicine

## 2019-09-02 DIAGNOSIS — Z1231 Encounter for screening mammogram for malignant neoplasm of breast: Secondary | ICD-10-CM | POA: Diagnosis not present

## 2019-09-03 ENCOUNTER — Ambulatory Visit: Payer: Self-pay | Admitting: Emergency Medicine

## 2019-09-03 ENCOUNTER — Other Ambulatory Visit: Payer: Self-pay

## 2019-09-03 ENCOUNTER — Encounter: Payer: Self-pay | Admitting: Emergency Medicine

## 2019-09-03 VITALS — BP 130/90 | HR 76 | Temp 98.9°F | Ht 63.0 in | Wt 193.0 lb

## 2019-09-03 DIAGNOSIS — H8110 Benign paroxysmal vertigo, unspecified ear: Secondary | ICD-10-CM

## 2019-09-03 MED ORDER — MECLIZINE HCL 25 MG PO TABS: 25.0000 mg | ORAL_TABLET | Freq: Three times a day (TID) | ORAL | 1 refills | Status: DC | PRN

## 2019-09-03 NOTE — Progress Notes (Signed)
  [facility] Provider Note       Time seen: 3:10 PM    I have reviewed the vital signs and the nursing notes.  HISTORY   Chief Complaint Dizziness   HPI [name] is a 49 y.o. female with a history of hypertension who presents today for dizziness that came on suddenly while she was at work.  She has never had this happen before.  Feels like if she were to turn her head suddenly the room would start spinning.  She denies near syncope, denies weakness, denies recent illness.  Past Medical History:  Diagnosis Date  . ADHD   . Elevated blood pressure reading   . Herpes simplex type 2 infection   . History of dysplastic nevus 05/17/2004   left buttock  . Over weight     Past Surgical History:  Procedure Laterality Date  . ABLATION    . CESAREAN SECTION     X3  . LIPO SUCTION    . TAMMY TUCK      Allergies Patient has no known allergies.  Review of Systems Constitutional: Negative for fever. Cardiovascular: Negative for chest pain. Respiratory: Negative for shortness of breath. Gastrointestinal: Negative for abdominal pain, vomiting and diarrhea. Musculoskeletal: Negative for back pain. Skin: Negative for rash. Neurological: Negative for headaches, focal weakness or numbness.  Positive for dizziness  All systems negative/normal/unremarkable except as stated in the HPI  ____________________________________________   PHYSICAL EXAM:  VITAL SIGNS: Vitals:   09/03/19 1453  BP: 130/90  Pulse: 76  Temp: 98.9 F (37.2 C)  SpO2: 98%    Constitutional: Alert and oriented. Well appearing and in no distress. Eyes: Conjunctivae are normal. Normal extraocular movements. ENT      Head: Normocephalic and atraumatic.  TMs are clear, no fluid      Nose: No congestion/rhinnorhea.      Mouth/Throat: Mucous membranes are moist.      Neck: No stridor. Cardiovascular: Normal rate, regular rhythm. No murmurs, rubs, or gallops. Respiratory:  Normal respiratory effort without tachypnea nor retractions. Breath sounds are clear and equal bilaterally. No wheezes/rales/rhonchi. Musculoskeletal: Nontender with normal range of motion in extremities. No lower extremity tenderness nor edema. Neurologic:  Normal speech and language. No gross focal neurologic deficits are appreciated.  Cranial nerves, strength and sensation are normal, negative Romberg Skin:  Skin is warm, dry and intact. No rash noted. Psychiatric: Speech and behavior are normal.   DIFFERENTIAL DIAGNOSIS  Peripheral vertigo, dehydration, electrolyte abnormality, anemia  ASSESSMENT AND PLAN  Vertigo   Plan: The patient had presented for dizziness which seems to indicate peripheral vertigo.  Overall her examination is otherwise unremarkable.  I will prescribe meclizine she can take as needed.  Follow-up if not improving  Lenise Arena MD    Note: This note was generated in part or whole with voice recognition software. Voice recognition is usually quite accurate but there are transcription errors that can and very often do occur. I apologize for any typographical errors that were not detected and corrected.

## 2019-09-03 NOTE — Progress Notes (Signed)
Dizziness came on suddenly today after getting to work.   Took a meclizine tablet - helped a little. No headache. Denies pain to ears or feeling like they are stopped up. Denies N/V.  AMD

## 2019-10-05 ENCOUNTER — Other Ambulatory Visit: Payer: Self-pay

## 2019-10-05 DIAGNOSIS — F909 Attention-deficit hyperactivity disorder, unspecified type: Secondary | ICD-10-CM

## 2019-10-05 MED ORDER — AMPHETAMINE-DEXTROAMPHETAMINE 20 MG PO TABS: 20.0000 mg | ORAL_TABLET | Freq: Two times a day (BID) | ORAL | 0 refills | Status: DC

## 2019-10-23 DIAGNOSIS — Z01419 Encounter for gynecological examination (general) (routine) without abnormal findings: Secondary | ICD-10-CM | POA: Diagnosis not present

## 2019-10-23 DIAGNOSIS — Z6835 Body mass index (BMI) 35.0-35.9, adult: Secondary | ICD-10-CM | POA: Diagnosis not present

## 2019-10-29 DIAGNOSIS — M9902 Segmental and somatic dysfunction of thoracic region: Secondary | ICD-10-CM | POA: Diagnosis not present

## 2019-10-29 DIAGNOSIS — M4603 Spinal enthesopathy, cervicothoracic region: Secondary | ICD-10-CM | POA: Diagnosis not present

## 2019-10-29 DIAGNOSIS — M9901 Segmental and somatic dysfunction of cervical region: Secondary | ICD-10-CM | POA: Diagnosis not present

## 2019-11-06 ENCOUNTER — Other Ambulatory Visit: Payer: Self-pay

## 2019-11-06 DIAGNOSIS — F909 Attention-deficit hyperactivity disorder, unspecified type: Secondary | ICD-10-CM

## 2019-11-06 MED ORDER — AMPHETAMINE-DEXTROAMPHETAMINE 20 MG PO TABS: 20.0000 mg | ORAL_TABLET | Freq: Two times a day (BID) | ORAL | 0 refills | Status: DC

## 2019-12-07 ENCOUNTER — Other Ambulatory Visit: Payer: Self-pay

## 2019-12-07 DIAGNOSIS — F909 Attention-deficit hyperactivity disorder, unspecified type: Secondary | ICD-10-CM

## 2019-12-07 MED ORDER — AMPHETAMINE-DEXTROAMPHETAMINE 20 MG PO TABS: 20.0000 mg | ORAL_TABLET | Freq: Two times a day (BID) | ORAL | 0 refills | Status: DC

## 2019-12-08 DIAGNOSIS — M461 Sacroiliitis, not elsewhere classified: Secondary | ICD-10-CM | POA: Diagnosis not present

## 2019-12-08 DIAGNOSIS — M9904 Segmental and somatic dysfunction of sacral region: Secondary | ICD-10-CM | POA: Diagnosis not present

## 2019-12-08 DIAGNOSIS — M5441 Lumbago with sciatica, right side: Secondary | ICD-10-CM | POA: Diagnosis not present

## 2019-12-08 DIAGNOSIS — M9903 Segmental and somatic dysfunction of lumbar region: Secondary | ICD-10-CM | POA: Diagnosis not present

## 2019-12-14 DIAGNOSIS — M9903 Segmental and somatic dysfunction of lumbar region: Secondary | ICD-10-CM | POA: Diagnosis not present

## 2019-12-14 DIAGNOSIS — M9904 Segmental and somatic dysfunction of sacral region: Secondary | ICD-10-CM | POA: Diagnosis not present

## 2019-12-14 DIAGNOSIS — M461 Sacroiliitis, not elsewhere classified: Secondary | ICD-10-CM | POA: Diagnosis not present

## 2019-12-14 DIAGNOSIS — M5441 Lumbago with sciatica, right side: Secondary | ICD-10-CM | POA: Diagnosis not present

## 2019-12-17 DIAGNOSIS — M461 Sacroiliitis, not elsewhere classified: Secondary | ICD-10-CM | POA: Diagnosis not present

## 2019-12-17 DIAGNOSIS — M9904 Segmental and somatic dysfunction of sacral region: Secondary | ICD-10-CM | POA: Diagnosis not present

## 2019-12-17 DIAGNOSIS — M9903 Segmental and somatic dysfunction of lumbar region: Secondary | ICD-10-CM | POA: Diagnosis not present

## 2019-12-17 DIAGNOSIS — M5441 Lumbago with sciatica, right side: Secondary | ICD-10-CM | POA: Diagnosis not present

## 2019-12-28 ENCOUNTER — Other Ambulatory Visit: Payer: Self-pay

## 2019-12-28 DIAGNOSIS — Z1152 Encounter for screening for COVID-19: Secondary | ICD-10-CM

## 2019-12-28 NOTE — Progress Notes (Signed)
Pt presented today with covid symptoms that started Friday night 12/25/19 Body aches, sore throat and fever. Pt stated she took an at home test for Covid that resulted Positive.  CL,RMA

## 2019-12-31 LAB — NOVEL CORONAVIRUS, NAA: SARS-CoV-2, NAA: DETECTED — AB

## 2019-12-31 LAB — SARS-COV-2, NAA 2 DAY TAT

## 2020-01-12 ENCOUNTER — Other Ambulatory Visit: Payer: Self-pay | Admitting: Emergency Medicine

## 2020-01-12 DIAGNOSIS — F909 Attention-deficit hyperactivity disorder, unspecified type: Secondary | ICD-10-CM

## 2020-01-21 DIAGNOSIS — M180 Bilateral primary osteoarthritis of first carpometacarpal joints: Secondary | ICD-10-CM | POA: Diagnosis not present

## 2020-01-29 ENCOUNTER — Other Ambulatory Visit: Payer: Self-pay | Admitting: Physician Assistant

## 2020-01-29 DIAGNOSIS — N3281 Overactive bladder: Secondary | ICD-10-CM

## 2020-02-04 ENCOUNTER — Telehealth: Payer: Self-pay

## 2020-02-04 NOTE — Telephone Encounter (Signed)
Refill request already in Epic - re-routed to [employer], PA-C.  AMD

## 2020-02-09 ENCOUNTER — Ambulatory Visit (INDEPENDENT_AMBULATORY_CARE_PROVIDER_SITE_OTHER): Payer: 59 | Admitting: Dermatology

## 2020-02-09 ENCOUNTER — Other Ambulatory Visit: Payer: Self-pay

## 2020-02-09 VITALS — BP 142/100 | HR 83

## 2020-02-09 DIAGNOSIS — L821 Other seborrheic keratosis: Secondary | ICD-10-CM

## 2020-02-09 DIAGNOSIS — D1800 Hemangioma unspecified site: Secondary | ICD-10-CM | POA: Diagnosis not present

## 2020-02-09 DIAGNOSIS — L814 Other melanin hyperpigmentation: Secondary | ICD-10-CM

## 2020-02-09 DIAGNOSIS — L300 Nummular dermatitis: Secondary | ICD-10-CM

## 2020-02-09 DIAGNOSIS — L700 Acne vulgaris: Secondary | ICD-10-CM

## 2020-02-09 DIAGNOSIS — L719 Rosacea, unspecified: Secondary | ICD-10-CM

## 2020-02-09 DIAGNOSIS — L738 Other specified follicular disorders: Secondary | ICD-10-CM

## 2020-02-09 DIAGNOSIS — D225 Melanocytic nevi of trunk: Secondary | ICD-10-CM | POA: Diagnosis not present

## 2020-02-09 DIAGNOSIS — Z1283 Encounter for screening for malignant neoplasm of skin: Secondary | ICD-10-CM | POA: Diagnosis not present

## 2020-02-09 DIAGNOSIS — D229 Melanocytic nevi, unspecified: Secondary | ICD-10-CM | POA: Diagnosis not present

## 2020-02-09 DIAGNOSIS — L578 Other skin changes due to chronic exposure to nonionizing radiation: Secondary | ICD-10-CM

## 2020-02-09 MED ORDER — HALCINONIDE 0.1 % EX CREA: TOPICAL_CREAM | CUTANEOUS | 1 refills | Status: DC

## 2020-02-09 MED ORDER — SPIRONOLACTONE 100 MG PO TABS: 100.0000 mg | ORAL_TABLET | Freq: Every day | ORAL | 3 refills | Status: DC

## 2020-02-09 NOTE — Patient Instructions (Addendum)
Melanoma ABCDEs  Melanoma is the most dangerous type of skin cancer, and is the leading cause of death from skin disease.  You are more likely to develop melanoma if you:  Have light-colored skin, light-colored eyes, or red or blond hair  Spend a lot of time in the sun  Tan regularly, either outdoors or in a tanning bed  Have had blistering sunburns, especially during childhood  Have a close family member who has had a melanoma  Have atypical moles or large birthmarks  Early detection of melanoma is key since treatment is typically straightforward and cure rates are extremely high if we catch it early.   The first sign of melanoma is often a change in a mole or a new dark spot.  The ABCDE system is a way of remembering the signs of melanoma.  A for asymmetry:  The two halves do not match. B for border:  The edges of the growth are irregular. C for color:  A mixture of colors are present instead of an even brown color. D for diameter:  Melanomas are usually (but not always) greater than 52mm - the size of a pencil eraser. E for evolution:  The spot keeps changing in size, shape, and color.  Please check your skin once per month between visits. You can use a small mirror in front and a large mirror behind you to keep an eye on the back side or your body.   If you see any new or changing lesions before your next follow-up, please call to schedule a visit.  Please continue daily skin protection including broad spectrum sunscreen SPF 30+ to sun-exposed areas, reapplying every 2 hours as needed when you're outdoors.   Spironolactone can cause increased urination and cause blood pressure to decrease. Please watch for signs of lightheadedness and be cautious when changing position. It can sometimes cause breast tenderness or an irregular period in premenopausal women. It can also increase potassium. The increase in potassium usually is not a concern unless you are taking other medicines that  also increase potassium, so please be sure your doctor knows all of the other medications you are taking. This medication should not be taken  by pregnant women.

## 2020-02-09 NOTE — Progress Notes (Signed)
Follow-Up Visit   Subjective  Renee Ayala is a 49 y.o. female who presents for the following: Annual Exam. Patient has a history of nummular dermatitis and would like a refill on the Halog cream. She uses it sparingly PRN. Patient c/o cystic nodules around the chin and jaw area and would like to discuss treatment options.   The following portions of the chart were reviewed this encounter and updated as appropriate:     Review of Systems:  No other skin or systemic complaints except as noted in HPI or Assessment and Plan.  Objective  Well appearing patient in no apparent distress; mood and affect are within normal limits.  A full examination was performed including scalp, head, eyes, ears, nose, lips, neck, chest, axillae, abdomen, back, buttocks, bilateral upper extremities, bilateral lower extremities, hands, feet, fingers, toes, fingernails, and toenails. All findings within normal limits unless otherwise noted below.  Objective  B/L arms: Xerosis on the arms. Otherwise clear today  Objective  R spinal lower back: 0.4 cm medium light brown speckled macule.  Objective  Face: Erythema with telangiectasias on the malar cheeks and nose. Pink papule of the R malar cheek.   Objective  Face: Yellow papules of the forehead.  Objective  Face: Clear today. Pt states she regularly get cystic lesions on her chin/jaw area  Assessment & Plan  Nummular dermatitis B/L arms  Well controlled Continue Halog cream QD-BID PRN flares. Avoid f/g/a. Topical steroids (such as triamcinolone, fluocinolone, fluocinonide, mometasone, clobetasol, halobetasol, betamethasone, hydrocortisone) can cause thinning and lightening of the skin if they are used for too long in the same area. Your physician has selected the right strength medicine for your problem and area affected on the body. Please use your medication only as directed by your physician to prevent side effects.    Recommend mild soap and  moisturizing cream 1-2 times daily.    Halcinonide (HALOG) 0.1 % CREA - B/L arms  Nevus R spinal lower back  Benign appearing, observe.  Rosacea Face  Mild Patient declines topical treatment today. Discussed BBL laser treatment for erythema.  Multiple treatments needed for best result.  $350 per treatment.   Recommend sunscreen/protection spf 30 or greater daily to face   Sebaceous hyperplasia Face  Benign appearing, observe.   Acne vulgaris Face  Cystic acne - face clear today   Start Spironolactone 100mg  po QD. For the first week take 1/2 tab po QD.   Spironolactone can cause increased urination and cause blood pressure to decrease. Please watch for signs of lightheadedness and be cautious when changing position. It can sometimes cause breast tenderness or an irregular period in premenopausal women. It can also increase potassium. The increase in potassium usually is not a concern unless you are taking other medicines that also increase potassium, so please be sure your doctor knows all of the other medications you are taking. This medication should not be taken  by pregnant women.     spironolactone (ALDACTONE) 100 MG tablet - Face  Lentigines - Scattered tan macules - Discussed due to sun exposure - Benign, observe - Call for any changes -Discussed BBL on the hands and arms  Seborrheic Keratoses - Stuck-on, waxy, tan-brown papules and plaques  - Discussed benign etiology and prognosis. - Observe - Call for any changes  Melanocytic Nevi - Tan-brown and/or pink-flesh-colored symmetric macules and papules - Benign appearing on exam today - Observation - Call clinic for new or changing moles - Recommend daily  use of broad spectrum spf 30+ sunscreen to sun-exposed areas.   Hemangiomas - Red papules - Discussed benign nature - Observe - Call for any changes  Actinic Damage - diffuse scaly erythematous macules with underlying dyspigmentation - Recommend  daily broad spectrum sunscreen SPF 30+ to sun-exposed areas, reapply every 2 hours as needed.  - Call for new or changing lesions.  Skin cancer screening performed today.  Return in about 1 year (around 02/08/2021) for TBSE; 3 months for acne follow up .  Luther Redo, CMA, am acting as scribe for Brendolyn Patty, MD .  Documentation: I have reviewed the above documentation for accuracy and completeness, and I agree with the above.  Brendolyn Patty MD

## 2020-02-15 ENCOUNTER — Telehealth: Payer: Self-pay

## 2020-02-15 ENCOUNTER — Other Ambulatory Visit: Payer: Self-pay

## 2020-02-15 DIAGNOSIS — L300 Nummular dermatitis: Secondary | ICD-10-CM

## 2020-02-15 MED ORDER — BRYHALI 0.01 % EX LOTN: TOPICAL_LOTION | CUTANEOUS | 1 refills | Status: DC

## 2020-02-15 NOTE — Progress Notes (Signed)
Original rx not covered by insurance.

## 2020-02-15 NOTE — Telephone Encounter (Signed)
Send in [place] lotion qd/bid to [facility] until itchy rash cleared, 100 gm, 1 rf, avoid f/g/a.  Send to specialty pharmacy [facility]).  They will automatically apply coupon to decrease copay.

## 2020-02-15 NOTE — Telephone Encounter (Signed)
Halcinonide 0.1% cream was denied by pts insurance. Pt must try and fail these alternatives; desoximetasone cream, fluocinonide (gel, ointment, or solution) and, Bryhali (halobetasol propionate) lotion.

## 2020-02-15 NOTE — Telephone Encounter (Signed)
Bryhali sent to [facility].

## 2020-02-18 ENCOUNTER — Other Ambulatory Visit: Payer: Self-pay

## 2020-02-18 DIAGNOSIS — F909 Attention-deficit hyperactivity disorder, unspecified type: Secondary | ICD-10-CM

## 2020-02-18 MED ORDER — AMPHETAMINE-DEXTROAMPHETAMINE 20 MG PO TABS: 20.0000 mg | ORAL_TABLET | Freq: Two times a day (BID) | ORAL | 0 refills | Status: DC

## 2020-02-22 ENCOUNTER — Ambulatory Visit: Payer: 59

## 2020-03-18 ENCOUNTER — Other Ambulatory Visit: Payer: Self-pay | Admitting: Physician Assistant

## 2020-03-18 DIAGNOSIS — F909 Attention-deficit hyperactivity disorder, unspecified type: Secondary | ICD-10-CM

## 2020-03-22 ENCOUNTER — Other Ambulatory Visit: Payer: Self-pay

## 2020-03-22 DIAGNOSIS — F909 Attention-deficit hyperactivity disorder, unspecified type: Secondary | ICD-10-CM

## 2020-03-22 MED ORDER — AMPHETAMINE-DEXTROAMPHETAMINE 20 MG PO TABS: 20.0000 mg | ORAL_TABLET | Freq: Two times a day (BID) | ORAL | 0 refills | Status: DC

## 2020-04-18 ENCOUNTER — Other Ambulatory Visit: Payer: Self-pay | Admitting: Physician Assistant

## 2020-04-18 DIAGNOSIS — F909 Attention-deficit hyperactivity disorder, unspecified type: Secondary | ICD-10-CM

## 2020-04-20 ENCOUNTER — Other Ambulatory Visit: Payer: Self-pay

## 2020-04-21 ENCOUNTER — Other Ambulatory Visit: Payer: Self-pay

## 2020-04-21 ENCOUNTER — Encounter: Payer: Self-pay | Admitting: Physician Assistant

## 2020-04-21 ENCOUNTER — Ambulatory Visit: Payer: Self-pay | Admitting: Physician Assistant

## 2020-04-21 VITALS — BP 126/85 | HR 79 | Temp 98.1°F | Ht 62.0 in | Wt 173.0 lb

## 2020-04-21 DIAGNOSIS — F909 Attention-deficit hyperactivity disorder, unspecified type: Secondary | ICD-10-CM

## 2020-04-21 NOTE — Progress Notes (Signed)
   Subjective: Medication refill    Patient ID: Renee Ayala, female    DOB: Jul 27, 1970, 50 y.o.   MRN: [id number]  HPI Patient presents for medication refill of Adderall.  Patient says no complaints with the medications.  Patient stated she is considering taking we can provide medication but has not started at this time.   Review of Systems ADHD    Objective:   Physical Exam No acute distress.  HEENT is unremarkable.  EOM and PERRLA are intact.  Neck is supple for adenopathy or bruits.  Lungs clear to auscultation.  Heart regular rate and rhythm.       Assessment & Plan: Medication refill for ADHD.  Patient prescription for Adderall was refilled for 1 month.  Patient advised she must follow-up before any refills.

## 2020-04-22 ENCOUNTER — Telehealth: Payer: Self-pay

## 2020-04-22 ENCOUNTER — Other Ambulatory Visit: Payer: Self-pay | Admitting: Physician Assistant

## 2020-04-22 DIAGNOSIS — F909 Attention-deficit hyperactivity disorder, unspecified type: Secondary | ICD-10-CM

## 2020-04-22 MED ORDER — AMPHETAMINE-DEXTROAMPHETAMINE 20 MG PO TABS: 20.0000 mg | ORAL_TABLET | Freq: Two times a day (BID) | ORAL | 0 refills | Status: DC

## 2020-04-22 NOTE — Telephone Encounter (Signed)
Rx refill request already in Epic - re-routed to Ron Smith, PA-C.  AMD 

## 2020-05-23 ENCOUNTER — Other Ambulatory Visit: Payer: Self-pay

## 2020-05-23 ENCOUNTER — Ambulatory Visit: Payer: 59 | Admitting: Dermatology

## 2020-05-23 DIAGNOSIS — L300 Nummular dermatitis: Secondary | ICD-10-CM | POA: Diagnosis not present

## 2020-05-23 MED ORDER — CLOBETASOL PROPIONATE 0.05 % EX SOLN: 1.0000 "application " | Freq: Two times a day (BID) | CUTANEOUS | 1 refills | Status: DC

## 2020-05-23 MED ORDER — HYDROCORTISONE 2.5 % EX CREA: TOPICAL_CREAM | Freq: Two times a day (BID) | CUTANEOUS | 1 refills | Status: DC | PRN

## 2020-05-23 NOTE — Progress Notes (Signed)
   Follow-Up Visit   Subjective  Renee Ayala is a 50 y.o. female who presents for the following: Pruritis (Arms, axilla, upper back halog qd/bid). Has itchy rash.  Using scented body wash gels. Doesn't use moisturizers regularly.   The following portions of the chart were reviewed this encounter and updated as appropriate:       Review of Systems:  No other skin or systemic complaints except as noted in HPI or Assessment and Plan.  Objective  Well appearing patient in no apparent distress; mood and affect are within normal limits.  A focused examination was performed including arms, upper back. Relevant physical exam findings are noted in the Assessment and Plan.  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        clobetasol (TEMOVATE) 0.05 % external solution - Right Upper Arm - Anterior  hydrocortisone 2.5 % cream - Right Upper Arm - Anterior  Other Related Medications Halobetasol Propionate (BRYHALI) 0.01 % LOTN  Return in about 1 month (around 06/20/2020) for nummular derm.   I, Othelia Pulling, RMA, am acting as scribe for Brendolyn Patty, MD . Documentation: I have reviewed the above documentation for accuracy and completeness, and I agree with the above.  Brendolyn Patty MD

## 2020-05-23 NOTE — Patient Instructions (Addendum)
Eczema Skin Care  Buy TWO 16oz jars of CeraVe moisturizing cream  [facility], [facility], [facility] (no prescription needed)  Costs about $15 per jar   Jar #1: Use as a moisturizer as needed. Can be applied to any area of the body. Use twice daily to unaffected areas.  Jar #2: Pour one 50ml bottle of clobetasol 0.05% solution into jar, mix well. Label this jar to indicate the medication has been added. Use twice daily to affected areas. Do not apply to face, groin or underarms.  Moisturizer may burn or sting initially. Try for at least 4 weeks.    Dry Skin Care  What causes dry skin?  Dry skin is common and results from inadequate moisture in the outer skin layers. Dry skin usually results from the excessive loss of moisture from the skin surface. This occurs due to two major factors: 1. Normally the skin's oil glands deposit a layer of oil on the skin's surface. This layer of oil prevents the loss of moisture from the skin. Exposure to soaps, cleaners, solvents, and disinfectants removes this oily film, allowing water to escape. 2. Water loss from the skin increases when the humidity is low. During winter months we spend a lot of time indoors where the air is heated. Heated air has very low humidity. This also contributes to dry skin.  A tendency for dry skin may accompany such disorders as eczema. Also, as people age, the number of functioning oil glands decreases, and the tendency toward dry skin can be a sensation of skin tightness when emerging from the shower.  How do I manage dry skin?  1. Humidify your environment. This can be accomplished by using a humidifier in your bedroom at night during winter months. 2. Bathing can actually put moisture back into your skin if done right. Take the following steps while bathing to sooth dry skin:  Avoid hot water, which only dries the skin and makes itching worse. Use warm water.  Avoid washcloths or extensive rubbing or scrubbing.  Use mild soaps  like unscented Dove, Oil of Olay, Cetaphil, Basis, or CeraVe.  If you take baths rather than showers, rinse off soap residue with clean water before getting out of tub.  Once out of the shower/tub, pat dry gently with a soft towel. Leave your skin damp.  While still damp, apply any medicated ointment/cream you were prescribed to the affected areas. After you apply your medicated ointment/cream, then apply your moisturizer to your whole body.This is the most important step in dry skin care. If this is omitted, your skin will continue to be dry.  The choice of moisturizer is also very important. In general, lotion will not provider enough moisture to severely dry skin because it is water based. You should use an ointment or cream. Moisturizers should also be unscented. Good choices include Vaseline (plain petrolatum), Aquaphor, Cetaphil, CeraVe, Vanicream, DML Forte, Aveeno moisture, or Eucerin Cream.  Bath oils can be helpful, but do not replace the application of moisturizer after the bath. In addition, they make the tub slippery causing an increased risk for falls. Therefore, we do not recommend their use.  

## 2020-06-02 DIAGNOSIS — M1811 Unilateral primary osteoarthritis of first carpometacarpal joint, right hand: Secondary | ICD-10-CM | POA: Diagnosis not present

## 2020-06-20 ENCOUNTER — Other Ambulatory Visit: Payer: Self-pay

## 2020-06-20 DIAGNOSIS — F909 Attention-deficit hyperactivity disorder, unspecified type: Secondary | ICD-10-CM

## 2020-06-20 MED ORDER — AMPHETAMINE-DEXTROAMPHETAMINE 20 MG PO TABS: 20.0000 mg | ORAL_TABLET | Freq: Two times a day (BID) | ORAL | 0 refills | Status: DC

## 2020-06-22 ENCOUNTER — Ambulatory Visit: Payer: 59 | Admitting: Dermatology

## 2020-07-15 DIAGNOSIS — Z20822 Contact with and (suspected) exposure to covid-19: Secondary | ICD-10-CM | POA: Diagnosis not present

## 2020-07-15 DIAGNOSIS — M1811 Unilateral primary osteoarthritis of first carpometacarpal joint, right hand: Secondary | ICD-10-CM | POA: Diagnosis not present

## 2020-07-15 DIAGNOSIS — M79641 Pain in right hand: Secondary | ICD-10-CM | POA: Diagnosis not present

## 2020-07-15 DIAGNOSIS — G8918 Other acute postprocedural pain: Secondary | ICD-10-CM | POA: Diagnosis not present

## 2020-07-20 ENCOUNTER — Other Ambulatory Visit: Payer: Self-pay

## 2020-07-20 ENCOUNTER — Ambulatory Visit: Payer: Self-pay

## 2020-07-20 DIAGNOSIS — Z01818 Encounter for other preprocedural examination: Secondary | ICD-10-CM

## 2020-07-20 LAB — POCT URINALYSIS DIPSTICK
Bilirubin, UA: NEGATIVE
Blood, UA: NEGATIVE
Glucose, UA: NEGATIVE
Ketones, UA: NEGATIVE
Leukocytes, UA: NEGATIVE
Nitrite, UA: NEGATIVE
Protein, UA: NEGATIVE
Spec Grav, UA: 1.025 (ref 1.010–1.025)
Urobilinogen, UA: 0.2 E.U./dL
pH, UA: 6 (ref 5.0–8.0)

## 2020-07-20 NOTE — Progress Notes (Signed)
Scheduled to complete physical 07/26/20 with Randel Pigg, PA-C.  AMD

## 2020-07-21 ENCOUNTER — Ambulatory Visit: Payer: 59

## 2020-07-21 LAB — CMP12+LP+TP+TSH+6AC+CBC/D/PLT
ALT: 22 IU/L (ref 0–32)
AST: 24 IU/L (ref 0–40)
Albumin/Globulin Ratio: 1.3 (ref 1.2–2.2)
Albumin: 3.5 g/dL — ABNORMAL LOW (ref 3.8–4.8)
Alkaline Phosphatase: 69 IU/L (ref 44–121)
BUN/Creatinine Ratio: 13 (ref 9–23)
BUN: 9 mg/dL (ref 6–24)
Basophils Absolute: 0 10*3/uL (ref 0.0–0.2)
Basos: 1 %
Bilirubin Total: 0.3 mg/dL (ref 0.0–1.2)
Calcium: 8.5 mg/dL — ABNORMAL LOW (ref 8.7–10.2)
Chloride: 103 mmol/L (ref 96–106)
Chol/HDL Ratio: 2.8 ratio (ref 0.0–4.4)
Cholesterol, Total: 151 mg/dL (ref 100–199)
Creatinine, Ser: 0.67 mg/dL (ref 0.57–1.00)
EOS (ABSOLUTE): 0.2 10*3/uL (ref 0.0–0.4)
Eos: 3 %
Estimated CHD Risk: <0.5 times avg. (ref 0.0–1.0)
Free Thyroxine Index: 2.2 (ref 1.2–4.9)
GGT: 12 IU/L (ref 0–60)
Globulin, Total: 2.6 g/dL (ref 1.5–4.5)
Glucose: 91 mg/dL (ref 65–99)
HDL: 53 mg/dL (ref >39)
Hematocrit: 42.1 % (ref 34.0–46.6)
Hemoglobin: 14.2 g/dL (ref 11.1–15.9)
Immature Grans (Abs): 0 10*3/uL (ref 0.0–0.1)
Immature Granulocytes: 0 %
Iron: 57 ug/dL (ref 27–159)
LDH: 175 IU/L (ref 119–226)
LDL Chol Calc (NIH): 83 mg/dL (ref 0–99)
Lymphocytes Absolute: 2.8 10*3/uL (ref 0.7–3.1)
Lymphs: 40 %
MCH: 29.1 pg (ref 26.6–33.0)
MCHC: 33.7 g/dL (ref 31.5–35.7)
MCV: 86 fL (ref 79–97)
Monocytes Absolute: 0.5 10*3/uL (ref 0.1–0.9)
Monocytes: 7 %
Neutrophils Absolute: 3.5 10*3/uL (ref 1.4–7.0)
Neutrophils: 49 %
Phosphorus: 3.7 mg/dL (ref 3.0–4.3)
Platelets: 215 10*3/uL (ref 150–450)
Potassium: 4.1 mmol/L (ref 3.5–5.2)
RBC: 4.88 x10E6/uL (ref 3.77–5.28)
RDW: 12.5 % (ref 11.7–15.4)
Sodium: 138 mmol/L (ref 134–144)
T3 Uptake Ratio: 32 % (ref 24–39)
T4, Total: 6.8 ug/dL (ref 4.5–12.0)
TSH: 2.3 u[IU]/mL (ref 0.450–4.500)
Total Protein: 6.1 g/dL (ref 6.0–8.5)
Triglycerides: 76 mg/dL (ref 0–149)
Uric Acid: 1.8 mg/dL — ABNORMAL LOW (ref 2.6–6.2)
VLDL Cholesterol Cal: 15 mg/dL (ref 5–40)
WBC: 7 10*3/uL (ref 3.4–10.8)
eGFR: 106 mL/min/{1.73_m2} (ref >59)

## 2020-07-25 ENCOUNTER — Ambulatory Visit: Payer: 59

## 2020-07-26 ENCOUNTER — Other Ambulatory Visit: Payer: Self-pay

## 2020-07-26 ENCOUNTER — Encounter: Payer: Self-pay | Admitting: Physician Assistant

## 2020-07-26 ENCOUNTER — Ambulatory Visit: Payer: Self-pay | Admitting: Physician Assistant

## 2020-07-26 DIAGNOSIS — F909 Attention-deficit hyperactivity disorder, unspecified type: Secondary | ICD-10-CM

## 2020-07-26 MED ORDER — AMPHETAMINE-DEXTROAMPHETAMINE 20 MG PO TABS
20.0000 mg | ORAL_TABLET | Freq: Two times a day (BID) | ORAL | 0 refills | Status: DC
Start: 2020-07-26 — End: 2020-08-31

## 2020-07-26 NOTE — Progress Notes (Signed)
   Subjective: Annual physical exam    Patient ID: Renee Ayala, female    DOB: [birth year], 50 y.o.   MRN: [id number]  HPI Presents annual physical exam.  Patient also requests refill of Adderall.   Review of Systems    ADHD Objective:   Physical Exam No acute distress.  BP is 138/87, pulse 64, respiration 14, temperature 97.9, and patient is 90% O2 sat on room air.  BMI is 30.54.  HEENT is unremarkable.  Neck is supple for adenopathy or bruits.  Lungs clear to auscultation.  Heart regular rate and rhythm.  Abdomen negative HSM, normoactive bowel sounds, soft, nontender to palpation.  Patient is wearing a cast on the right hand secondary to recent surgery.  No other obvious deformity to the upper or lower extremities.  Patient had full and equal range of motion of the upper and lower extremities.  No obvious cervical or lumbar spine deformity.  Patient has full and equal range of motion cervical lumbar spine.  Cranial nerves II through XII grossly intact.       Assessment & Plan: Well exam  Discussed no acute findings on labs patient.  Patient prescription Adderall was refilled.  Patient advised follow-up as necessary.

## 2020-07-26 NOTE — Progress Notes (Signed)
Pt denies any issues or concerns at this time. Pt needs refill64 on adderall. CL,RMA

## 2020-07-28 DIAGNOSIS — M1811 Unilateral primary osteoarthritis of first carpometacarpal joint, right hand: Secondary | ICD-10-CM | POA: Diagnosis not present

## 2020-07-28 DIAGNOSIS — Z4889 Encounter for other specified surgical aftercare: Secondary | ICD-10-CM | POA: Diagnosis not present

## 2020-08-11 DIAGNOSIS — M1811 Unilateral primary osteoarthritis of first carpometacarpal joint, right hand: Secondary | ICD-10-CM | POA: Diagnosis not present

## 2020-08-23 DIAGNOSIS — M1811 Unilateral primary osteoarthritis of first carpometacarpal joint, right hand: Secondary | ICD-10-CM | POA: Diagnosis not present

## 2020-08-30 ENCOUNTER — Other Ambulatory Visit: Payer: Self-pay | Admitting: Physician Assistant

## 2020-08-30 DIAGNOSIS — N3281 Overactive bladder: Secondary | ICD-10-CM

## 2020-08-31 ENCOUNTER — Other Ambulatory Visit: Payer: Self-pay

## 2020-08-31 DIAGNOSIS — F909 Attention-deficit hyperactivity disorder, unspecified type: Secondary | ICD-10-CM

## 2020-08-31 MED ORDER — AMPHETAMINE-DEXTROAMPHETAMINE 20 MG PO TABS: 20.0000 mg | ORAL_TABLET | Freq: Two times a day (BID) | ORAL | 0 refills | Status: DC

## 2020-09-17 DIAGNOSIS — M7061 Trochanteric bursitis, right hip: Secondary | ICD-10-CM | POA: Diagnosis not present

## 2020-10-01 ENCOUNTER — Other Ambulatory Visit: Payer: Self-pay | Admitting: Physician Assistant

## 2020-10-01 DIAGNOSIS — F909 Attention-deficit hyperactivity disorder, unspecified type: Secondary | ICD-10-CM

## 2020-10-05 ENCOUNTER — Other Ambulatory Visit: Payer: Self-pay

## 2020-10-05 NOTE — Telephone Encounter (Signed)
When I putting in Rx refill request in Epic, there was already a refill request in the system. Re-routed it to Orson Gear, NP-C.  AMD

## 2020-10-06 DIAGNOSIS — M7061 Trochanteric bursitis, right hip: Secondary | ICD-10-CM | POA: Diagnosis not present

## 2020-10-07 ENCOUNTER — Other Ambulatory Visit: Payer: Self-pay

## 2020-10-07 NOTE — Telephone Encounter (Signed)
Request sent to A. Scarboro, NP-C on 10/05/20.   Re-routed tRx refill request to Randel Pigg, PA-C.  AMD

## 2020-10-18 DIAGNOSIS — M7061 Trochanteric bursitis, right hip: Secondary | ICD-10-CM | POA: Diagnosis not present

## 2020-10-25 DIAGNOSIS — M7061 Trochanteric bursitis, right hip: Secondary | ICD-10-CM | POA: Diagnosis not present

## 2020-10-27 DIAGNOSIS — M7061 Trochanteric bursitis, right hip: Secondary | ICD-10-CM | POA: Diagnosis not present

## 2020-11-03 ENCOUNTER — Other Ambulatory Visit: Payer: Self-pay | Admitting: Physician Assistant

## 2020-11-03 DIAGNOSIS — F909 Attention-deficit hyperactivity disorder, unspecified type: Secondary | ICD-10-CM

## 2020-11-08 ENCOUNTER — Other Ambulatory Visit: Payer: Self-pay

## 2020-11-08 NOTE — Telephone Encounter (Signed)
When I was putting in Rx refill information into Epic, box opened up indicating a Rx refill request already in Epic. Re-routed to Randel Pigg, PA-C.  AMD

## 2020-11-28 ENCOUNTER — Other Ambulatory Visit: Payer: Self-pay

## 2020-11-28 ENCOUNTER — Ambulatory Visit: Payer: Self-pay

## 2020-11-28 VITALS — BP 124/78 | HR 64

## 2020-11-28 DIAGNOSIS — Z0130 Encounter for examination of blood pressure without abnormal findings: Secondary | ICD-10-CM

## 2020-11-28 NOTE — Progress Notes (Signed)
Pt presents today for BP check. CL,RMA 

## 2020-12-01 ENCOUNTER — Other Ambulatory Visit: Payer: Self-pay | Admitting: Physician Assistant

## 2020-12-01 DIAGNOSIS — F909 Attention-deficit hyperactivity disorder, unspecified type: Secondary | ICD-10-CM

## 2020-12-06 ENCOUNTER — Other Ambulatory Visit: Payer: Self-pay

## 2020-12-06 NOTE — Telephone Encounter (Signed)
When I went to put in Rx Refill requests in Epic, boxed opened up in the system that a request was already. Re-routed both requests to Randel Pigg, PA-C.  AMD

## 2020-12-15 ENCOUNTER — Ambulatory Visit (INDEPENDENT_AMBULATORY_CARE_PROVIDER_SITE_OTHER): Payer: 59

## 2020-12-15 ENCOUNTER — Ambulatory Visit (INDEPENDENT_AMBULATORY_CARE_PROVIDER_SITE_OTHER): Payer: 59 | Admitting: Podiatry

## 2020-12-15 ENCOUNTER — Encounter: Payer: Self-pay | Admitting: Podiatry

## 2020-12-15 ENCOUNTER — Other Ambulatory Visit: Payer: Self-pay

## 2020-12-15 DIAGNOSIS — M21619 Bunion of unspecified foot: Secondary | ICD-10-CM

## 2020-12-15 DIAGNOSIS — M21612 Bunion of left foot: Secondary | ICD-10-CM | POA: Diagnosis not present

## 2020-12-15 NOTE — Patient Instructions (Signed)

## 2020-12-15 NOTE — Progress Notes (Signed)
Subjective:   Patient ID: Renee Ayala, female   DOB: 50 y.o.   MRN: [id number]   HPI Patient presents stating this left bunion is starting to become aggravating and sore and she would like to have something done with it.  States that its been gradually getting worse over the years she is tried wider shoes she is tried soaks and other modalities without relief.  Patient does not smoke likes to be active   Review of Systems  All other systems reviewed and are negative.      Objective:  Physical Exam Vitals and nursing note reviewed.  Constitutional:      Appearance: She is well-developed.  Pulmonary:     Effort: Pulmonary effort is normal.  Musculoskeletal:        General: Normal range of motion.  Skin:    General: Skin is warm.  Neurological:     Mental Status: She is alert.    Neurovascular status found to be intact muscle strength adequate range of motion within normal limits.  Patient has a large hyperostosis medial aspect first metatarsal head left red when pressed with mild deviation of the hallux but overall clinically not as bad as radiographically with discomfort associated with the structure of the bunion     Assessment:  Significant bunion deformity left foot that is painful when pressed and has not responded conservatively     Plan:  H&P reviewed condition recommended correction of deformity.  Patient wants to have this done we discussed distal versus proximal osteotomy and she would like a procedure where she can stay weightbearing and I do think she could have a distal osteotomy get good clinical correction even though I will not get full radiographic correction.  I reviewed this in great length she wants to go this route versus a proximal fusion and we will have her back prior to surgery to go over in greater detail.  Patient is encouraged to call questions concerns which may arise prior and we will go over this when we see her back again  X-rays indicate  significant elevation of the intermetatarsal angle left of approximate 17 degrees with mild adductus deformity

## 2020-12-15 NOTE — Progress Notes (Signed)
left

## 2021-01-03 ENCOUNTER — Other Ambulatory Visit: Payer: Self-pay

## 2021-01-03 DIAGNOSIS — F909 Attention-deficit hyperactivity disorder, unspecified type: Secondary | ICD-10-CM

## 2021-01-03 MED ORDER — AMPHETAMINE-DEXTROAMPHETAMINE 20 MG PO TABS: 20.0000 mg | ORAL_TABLET | Freq: Two times a day (BID) | ORAL | 0 refills | Status: DC

## 2021-01-10 DIAGNOSIS — L239 Allergic contact dermatitis, unspecified cause: Secondary | ICD-10-CM | POA: Diagnosis not present

## 2021-01-26 DIAGNOSIS — H5213 Myopia, bilateral: Secondary | ICD-10-CM | POA: Diagnosis not present

## 2021-01-27 DIAGNOSIS — M7061 Trochanteric bursitis, right hip: Secondary | ICD-10-CM | POA: Diagnosis not present

## 2021-02-06 ENCOUNTER — Ambulatory Visit (INDEPENDENT_AMBULATORY_CARE_PROVIDER_SITE_OTHER): Payer: 59 | Admitting: Podiatry

## 2021-02-06 ENCOUNTER — Other Ambulatory Visit: Payer: Self-pay

## 2021-02-06 ENCOUNTER — Encounter: Payer: Self-pay | Admitting: Podiatry

## 2021-02-06 DIAGNOSIS — M21619 Bunion of unspecified foot: Secondary | ICD-10-CM

## 2021-02-06 DIAGNOSIS — M21612 Bunion of left foot: Secondary | ICD-10-CM | POA: Diagnosis not present

## 2021-02-06 DIAGNOSIS — F909 Attention-deficit hyperactivity disorder, unspecified type: Secondary | ICD-10-CM

## 2021-02-06 DIAGNOSIS — M21622 Bunionette of left foot: Secondary | ICD-10-CM | POA: Diagnosis not present

## 2021-02-06 MED ORDER — AMPHETAMINE-DEXTROAMPHETAMINE 20 MG PO TABS: 20.0000 mg | ORAL_TABLET | Freq: Two times a day (BID) | ORAL | 0 refills | Status: DC

## 2021-02-06 NOTE — Progress Notes (Signed)
Subjective:   Patient ID: Renee Ayala, female   DOB: 50 y.o.   MRN: [id number]   HPI Patient presents with a painful bunion of the left over right foot and also a painful bunion on the outside of the metatarsal left over right.  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  Patient wants surgery and at this point I allowed her to read consent form going over alternative treatments complications as everything is outlined and patient is comfortable with this wants surgery signed consent form and is given all preoperative instructions.  I dispensed air fracture walker for the postoperative period with all instructions

## 2021-02-07 ENCOUNTER — Ambulatory Visit (INDEPENDENT_AMBULATORY_CARE_PROVIDER_SITE_OTHER): Payer: 59 | Admitting: Dermatology

## 2021-02-07 DIAGNOSIS — Z86018 Personal history of other benign neoplasm: Secondary | ICD-10-CM

## 2021-02-07 DIAGNOSIS — L300 Nummular dermatitis: Secondary | ICD-10-CM | POA: Diagnosis not present

## 2021-02-07 DIAGNOSIS — L719 Rosacea, unspecified: Secondary | ICD-10-CM | POA: Diagnosis not present

## 2021-02-07 DIAGNOSIS — D225 Melanocytic nevi of trunk: Secondary | ICD-10-CM

## 2021-02-07 DIAGNOSIS — D692 Other nonthrombocytopenic purpura: Secondary | ICD-10-CM | POA: Diagnosis not present

## 2021-02-07 DIAGNOSIS — D229 Melanocytic nevi, unspecified: Secondary | ICD-10-CM

## 2021-02-07 DIAGNOSIS — L814 Other melanin hyperpigmentation: Secondary | ICD-10-CM

## 2021-02-07 DIAGNOSIS — L821 Other seborrheic keratosis: Secondary | ICD-10-CM | POA: Diagnosis not present

## 2021-02-07 DIAGNOSIS — Z1283 Encounter for screening for malignant neoplasm of skin: Secondary | ICD-10-CM

## 2021-02-07 DIAGNOSIS — L578 Other skin changes due to chronic exposure to nonionizing radiation: Secondary | ICD-10-CM

## 2021-02-07 DIAGNOSIS — D1800 Hemangioma unspecified site: Secondary | ICD-10-CM

## 2021-02-07 MED ORDER — CLOBETASOL PROPIONATE 0.05 % EX SOLN
1.0000 | Freq: Two times a day (BID) | CUTANEOUS | 1 refills | Status: DC
Start: 2021-02-07 — End: 2021-12-13

## 2021-02-07 NOTE — Patient Instructions (Signed)
Topical steroids (such as triamcinolone, fluocinolone, fluocinonide, mometasone, clobetasol, halobetasol, betamethasone, hydrocortisone) can cause thinning and lightening of the skin if they are used for too long in the same area. Your physician has selected the right strength medicine for your problem and area affected on the body. Please use your medication only as directed by your physician to prevent side effects.      Melanoma ABCDEs  Melanoma is the most dangerous type of skin cancer, and is the leading cause of death from skin disease.  You are more likely to develop melanoma if you: Have light-colored skin, light-colored eyes, or red or blond hair Spend a lot of time in the sun Tan regularly, either outdoors or in a tanning bed Have had blistering sunburns, especially during childhood Have a close family member who has had a melanoma Have atypical moles or large birthmarks  Early detection of melanoma is key since treatment is typically straightforward and cure rates are extremely high if we catch it early.   The first sign of melanoma is often a change in a mole or a new dark spot.  The ABCDE system is a way of remembering the signs of melanoma.  A for asymmetry:  The two halves do not match. B for border:  The edges of the growth are irregular. C for color:  A mixture of colors are present instead of an even brown color. D for diameter:  Melanomas are usually (but not always) greater than 32mm - the size of a pencil eraser. E for evolution:  The spot keeps changing in size, shape, and color.  Please check your skin once per month between visits. You can use a small mirror in front and a large mirror behind you to keep an eye on the back side or your body.   If you see any new or changing lesions before your next follow-up, please call to schedule a visit.  Please continue daily skin protection including broad spectrum sunscreen SPF 30+ to sun-exposed areas, reapplying every 2  hours as needed when you're outdoors.   Staying in the shade or wearing long sleeves, sun glasses (UVA+UVB protection) and wide brim hats (4-inch brim around the entire circumference of the hat) are also recommended for sun protection.    If you have any questions or concerns for your doctor, please call our main line at [phone] and press option 4 to reach your doctor's medical assistant. If no one answers, please leave a voicemail as directed and we will return your call as soon as possible. Messages left after 4 pm will be answered the following business day.   You may also send [place] a message via [facility]. We typically respond to MyChart messages within 1-2 business days.  For prescription refills, please ask your pharmacy to contact our office. Our fax number is [phone].  If you have an urgent issue when the clinic is closed that cannot wait until the next business day, you can page your doctor at the number below.    Please note that while we do our best to be available for urgent issues outside of office hours, we are not available 24/7.   If you have an urgent issue and are unable to reach [place], you may choose to seek medical care at your doctor's office, retail clinic, urgent care center, or emergency room.  If you have a medical emergency, please immediately call 911 or go to the emergency department.  Pager Numbers  - Dr. Nehemiah Massed:  [phone]  - Dr. Laurence Ferrari: [phone]  - Dr. Nicole Kindred: [phone]  In the event of inclement weather, please call our main line at [phone] for an update on the status of any delays or closures.  Dermatology Medication Tips: Please keep the boxes that topical medications come in in order to help keep track of the instructions about where and how to use these. Pharmacies typically print the medication instructions only on the boxes and not directly on the medication tubes.   If your medication is too expensive, please contact our office  at [phone] option 4 or send [place] a message through [facility].   We are unable to tell what your co-pay for medications will be in advance as this is different depending on your insurance coverage. However, we may be able to find a substitute medication at lower cost or fill out paperwork to get insurance to cover a needed medication.   If a prior authorization is required to get your medication covered by your insurance company, please allow [place] 1-2 business days to complete this process.  Drug prices often vary depending on where the prescription is filled and some pharmacies may offer cheaper prices.  The website www.goodrx.com contains coupons for medications through different pharmacies. The prices here do not account for what the cost may be with help from insurance (it may be cheaper with your insurance), but the website can give you the price if you did not use any insurance.  - You can print the associated coupon and take it with your prescription to the pharmacy.  - You may also stop by our office during regular business hours and pick up a GoodRx coupon card.  - If you need your prescription sent electronically to a different pharmacy, notify our office through [facility] or by phone at [phone] option 4.

## 2021-02-07 NOTE — Progress Notes (Signed)
Follow-Up Visit   Subjective  Renee Ayala is a 50 y.o. female who presents for the following: Follow-up (Patient here today for tbse. She reports no new concerns. ).  She has h/o nummular dermatitis and is well-controlled with Clob/CeraVe mixture.  She has a h/o dysplastic nevus.   Patient here for full body skin exam and skin cancer screening.  The following portions of the chart were reviewed this encounter and updated as appropriate:      Review of Systems: No other skin or systemic complaints except as noted in HPI or Assessment and Plan.   Objective  Well appearing patient in no apparent distress; mood and affect are within normal limits.  A full examination was performed including scalp, head, eyes, ears, nose, lips, neck, chest, axillae, abdomen, back, buttocks, bilateral upper extremities, bilateral lower extremities, hands, feet, fingers, toes, fingernails, and toenails. All findings within normal limits unless otherwise noted below.  mid face and chin Few inflammatory papules at chin   bilateral arms, and chest Clear with mild xerosis   spinal lower back 4 mm med light brown speckled macule   Assessment & Plan  Rosacea mid face and chin  Rosacea is a chronic progressive skin condition usually affecting the face of adults, causing redness and/or acne bumps. It is treatable but not curable. It sometimes affects the eyes (ocular rosacea) as well. It may respond to topical and/or systemic medication and can flare with stress, sun exposure, alcohol, exercise and some foods.  Daily application of broad spectrum spf 30+ sunscreen to face is recommended to reduce flares.  Also discussed BBL for erythema.  Patient defers treatment at this time.  Not bothersome.  Discussed bbl treatment - defers treatment at this time.   Nummular dermatitis bilateral arms, and chest  Chronic condition with duration or expected duration over one year. Currently  well-controlled.  Continue Clobetasol / cerave mix qd / bid prn itchy areas avoid f/g/a  Continue cerave moisturizer qd  Continue HC 2.5 % cream qd/bid as needed at axillas prn itch   Topical steroids (such as triamcinolone, fluocinolone, fluocinonide, mometasone, clobetasol, halobetasol, betamethasone, hydrocortisone) can cause thinning and lightening of the skin if they are used for too long in the same area. Your physician has selected the right strength medicine for your problem and area affected on the body. Please use your medication only as directed by your physician to prevent side effects.      Related Medications hydrocortisone 2.5 % cream Apply topically 2 (two) times daily as needed (Rash). Apply to itchy rash under arms qd/bid until clear, then prn flares  clobetasol (TEMOVATE) 0.05 % external solution Apply 1 application topically 2 (two) times daily. Mix whole bottle in 1 tub of cerave cream and use qd/bid itchy rash on arms, chest and back, avoid face, groin, axilla  Nevus spinal lower back  Benign-appearing.  Stable. Observation.  Call clinic for new or changing lesions.  Recommend daily use of broad spectrum spf 30+ sunscreen to sun-exposed areas.   Lentigines - Scattered tan macules - Due to sun exposure - Benign-appearing, observe - Recommend daily broad spectrum sunscreen SPF 30+ to sun-exposed areas, reapply every 2 hours as needed. - Call for any changes  Purpura - Chronic; persistent and recurrent.  Treatable, but not curable. - Violaceous macules and patches at arms  - Benign - Related to trauma, age, sun damage and/or use of blood thinners, chronic use of topical and/or oral steroids - Observe -  Can use OTC arnica containing moisturizer such as Dermend Bruise Formula if desired - Call for worsening or other concerns  Seborrheic Keratoses - Stuck-on, waxy, tan-brown papules and/or plaques  - Benign-appearing - Discussed benign etiology and  prognosis. - Observe - Call for any changes  Melanocytic Nevi - Tan-brown and/or pink-flesh-colored symmetric macules and papules - Benign appearing on exam today - Observation - Call clinic for new or changing moles - Recommend daily use of broad spectrum spf 30+ sunscreen to sun-exposed areas.   Hemangiomas - Red papules - Discussed benign nature - Observe - Call for any changes  Actinic Damage - Chronic condition, secondary to cumulative UV/sun exposure - diffuse scaly erythematous macules with underlying dyspigmentation - Recommend daily broad spectrum sunscreen SPF 30+ to sun-exposed areas, reapply every 2 hours as needed.  - Staying in the shade or wearing long sleeves, sun glasses (UVA+UVB protection) and wide brim hats (4-inch brim around the entire circumference of the hat) are also recommended for sun protection.  - Call for new or changing lesions.  History of Dysplastic Nevi - No evidence of recurrence today at left buttock  - Recommend regular full body skin exams - Recommend daily broad spectrum sunscreen SPF 30+ to sun-exposed areas, reapply every 2 hours as needed.  - Call if any new or changing lesions are noted between office visits  Skin cancer screening performed today.  Return for 1 year tbse . I, Ruthell Rummage, CMA, am acting as scribe for Brendolyn Patty, MD.  Documentation: I have reviewed the above documentation for accuracy and completeness, and I agree with the above.  Brendolyn Patty MD

## 2021-02-10 ENCOUNTER — Telehealth: Payer: Self-pay | Admitting: Urology

## 2021-02-10 NOTE — Telephone Encounter (Signed)
DOS - 02/28/21  [place] BUNIONECTOMY LEFT --- 39672 METATARSAL OSTEOTOMY 5TH LEFT --- [id number]  [employer] EFFECTIVE DATE - 10/15/2018   PER [employer]'S AUTOMATIVE SYSTEM FOR CPT CODES 50413 AND 64383 NO PRIOR AUTH IS REQUIRED.  REF # [id number]

## 2021-02-21 ENCOUNTER — Ambulatory Visit: Payer: Self-pay | Admitting: Physician Assistant

## 2021-02-21 ENCOUNTER — Other Ambulatory Visit: Payer: Self-pay

## 2021-02-21 ENCOUNTER — Encounter: Payer: Self-pay | Admitting: Physician Assistant

## 2021-02-21 DIAGNOSIS — F909 Attention-deficit hyperactivity disorder, unspecified type: Secondary | ICD-10-CM

## 2021-02-21 MED ORDER — MECLIZINE HCL 25 MG PO TABS: 25.0000 mg | ORAL_TABLET | Freq: Three times a day (TID) | ORAL | 1 refills | Status: DC | PRN

## 2021-02-21 MED ORDER — AMPHETAMINE-DEXTROAMPHETAMINE 20 MG PO TABS: 20.0000 mg | ORAL_TABLET | Freq: Two times a day (BID) | ORAL | 0 refills | Status: DC

## 2021-02-21 NOTE — Progress Notes (Signed)
Requesting Rx refill for Antivert Friday - dizziness, nausea & headache that lasted almost all weekend Symptoms finally eases up yesterday after chiropractic visit Previously prescribed 08/2019 by Dr. Jimmye Norman

## 2021-02-21 NOTE — Progress Notes (Signed)
   Subjective: ADHD    Patient ID: Renee Ayala, female    DOB: 1970-12-29, 51 y.o.   MRN: [id number]  HPI Patient is here today for 77-month follow-up for ADHD.  Patient currently taking Adderall to 20 mg twice daily.  Patient states medication continues to help with focus on her job and approaching department.  Denies side effects of weight loss, appetit suppression, anxiety, palpitations.   Review of Systems Negative except for complaint    Objective:   Physical Exam No acute distress.  Temperature 97.8, pulse 82, respirations 16, BP is 130/86, patient 98% O2 sat on room air.  Patient alert and orientated.       Assessment & Plan: ADHD   Prescription for Adderall at 20 mg twice daily renewed.

## 2021-02-27 MED ORDER — OXYCODONE-ACETAMINOPHEN 10-325 MG PO TABS: 1.0000 | ORAL_TABLET | ORAL | 0 refills | Status: DC | PRN

## 2021-02-27 MED ORDER — ONDANSETRON HCL 4 MG PO TABS: 4.0000 mg | ORAL_TABLET | Freq: Three times a day (TID) | ORAL | 0 refills | Status: DC | PRN

## 2021-02-27 NOTE — Addendum Note (Signed)
Addended by: Wallene Huh on: 02/27/2021 12:40 PM   Modules accepted: Orders

## 2021-02-28 ENCOUNTER — Encounter: Payer: Self-pay | Admitting: Podiatry

## 2021-02-28 DIAGNOSIS — M2012 Hallux valgus (acquired), left foot: Secondary | ICD-10-CM | POA: Diagnosis not present

## 2021-02-28 DIAGNOSIS — M21622 Bunionette of left foot: Secondary | ICD-10-CM | POA: Diagnosis not present

## 2021-03-06 ENCOUNTER — Ambulatory Visit (INDEPENDENT_AMBULATORY_CARE_PROVIDER_SITE_OTHER): Payer: 59

## 2021-03-06 ENCOUNTER — Other Ambulatory Visit: Payer: Self-pay

## 2021-03-06 ENCOUNTER — Ambulatory Visit (INDEPENDENT_AMBULATORY_CARE_PROVIDER_SITE_OTHER): Payer: 59 | Admitting: Podiatry

## 2021-03-06 ENCOUNTER — Encounter: Payer: Self-pay | Admitting: Podiatry

## 2021-03-06 DIAGNOSIS — Z9889 Other specified postprocedural states: Secondary | ICD-10-CM

## 2021-03-06 NOTE — Progress Notes (Signed)
Subjective:   Patient ID: Renee Ayala, female   DOB: 50 y.o.   MRN: [id number]   HPI Patient presents painful bunion deformity left and tailor's bunion deformity left this done very well surgically which was done 6 days ago   ROS      Objective:  Physical Exam  Neurovascular status intact negative Bevelyn Buckles' sign noted wound edges well coapted first MPJ good alignment fifth MPJ good alignment mild swelling consistent with procedures     Assessment:  Doing very well post foot surgery left     Plan:  H&P x-rays reviewed advised on continued elevation compression immobilization and reappoint in 3 weeks or earlier if needed and explained range of motion exercises  X-rays indicate osteotomies healing well slight stress of the fifth metatarsal but it should heal uneventfully even though there could be a slight bit of secondary bone healing

## 2021-03-13 ENCOUNTER — Other Ambulatory Visit: Payer: Self-pay | Admitting: Physician Assistant

## 2021-03-13 DIAGNOSIS — F909 Attention-deficit hyperactivity disorder, unspecified type: Secondary | ICD-10-CM

## 2021-03-13 MED ORDER — AMPHETAMINE-DEXTROAMPHETAMINE 20 MG PO TABS: 20.0000 mg | ORAL_TABLET | Freq: Two times a day (BID) | ORAL | 0 refills | Status: DC

## 2021-03-22 ENCOUNTER — Encounter: Payer: Self-pay | Admitting: Physician Assistant

## 2021-03-22 ENCOUNTER — Other Ambulatory Visit: Payer: Self-pay

## 2021-03-22 ENCOUNTER — Ambulatory Visit: Payer: Self-pay | Admitting: Physician Assistant

## 2021-03-22 VITALS — BP 146/95 | HR 93 | Temp 98.6°F

## 2021-03-22 DIAGNOSIS — R309 Painful micturition, unspecified: Secondary | ICD-10-CM

## 2021-03-22 DIAGNOSIS — J3489 Other specified disorders of nose and nasal sinuses: Secondary | ICD-10-CM

## 2021-03-22 DIAGNOSIS — J0100 Acute maxillary sinusitis, unspecified: Secondary | ICD-10-CM

## 2021-03-22 LAB — POC COVID19 BINAXNOW: SARS Coronavirus 2 Ag: NEGATIVE

## 2021-03-22 LAB — POCT URINALYSIS DIPSTICK
Bilirubin, UA: NEGATIVE
Blood, UA: POSITIVE
Glucose, UA: NEGATIVE
Ketones, UA: NEGATIVE
Leukocytes, UA: NEGATIVE
Nitrite, UA: NEGATIVE
Protein, UA: NEGATIVE
Spec Grav, UA: 1.025 (ref 1.010–1.025)
Urobilinogen, UA: 0.2 E.U./dL
pH, UA: 6 (ref 5.0–8.0)

## 2021-03-22 LAB — POCT INFLUENZA A/B
Influenza A, POC: NEGATIVE
Influenza B, POC: NEGATIVE

## 2021-03-22 MED ORDER — PHENAZOPYRIDINE HCL 200 MG PO TABS: 200.0000 mg | ORAL_TABLET | Freq: Three times a day (TID) | ORAL | 0 refills | Status: DC | PRN

## 2021-03-22 MED ORDER — SULFAMETHOXAZOLE-TRIMETHOPRIM 800-160 MG PO TABS: 1.0000 | ORAL_TABLET | Freq: Two times a day (BID) | ORAL | 0 refills | Status: DC

## 2021-03-22 MED ORDER — FEXOFENADINE-PSEUDOEPHED ER 60-120 MG PO TB12: 1.0000 | ORAL_TABLET | Freq: Two times a day (BID) | ORAL | 0 refills | Status: DC

## 2021-03-22 NOTE — Progress Notes (Signed)
   Subjective: Sinus congestion and dysuria    Patient ID: Renee Ayala, female    DOB: 1970/06/02, 49 y.o.   MRN: [id number]  HPI Patient presents with 1 week of sinus congestion and decreased voice volume.  Patient also states she developed dysuria and urinary frequency yesterday.  Patient tested negative for COVID-19 and influenza.  Denies recent travel.   Review of Systems ADHD    Objective:   Physical Exam No acute distress.  See nurses notes and vital signs. HEENT is remarkable for right maxillary guarding.  Bilateral edematous turbinates with postnasal drainage.  Decreased voice volume.  Urinalysis unremarkable except for 1+ blood. Neck is supple without lymphadenopathy or bruits.  Lungs clear to auscultation.  Heart regular rate and rhythm.       Assessment & Plan: Subacute maxillary sinusitis and dysuria.   Patient given a prescription for Bactrim DS, Allegra-D, and Pyridium.  Patient advised follow-up in 5 days if no improvement or worsening complaints.

## 2021-03-22 NOTE — Progress Notes (Signed)
Renee Ayala reports onset of congestion, hoarseness and body aches today. Painful, burning urination with dribbling and frequency also reported. Rapid Flu and COVID complete.   Rapid COVID and FLU negative. PCR sent for testing

## 2021-03-22 NOTE — Progress Notes (Signed)
UA notable for Blood + 10  Neg Leuk. Painful, burning urination with some dribbling started this morning

## 2021-03-27 ENCOUNTER — Other Ambulatory Visit: Payer: Self-pay

## 2021-03-27 ENCOUNTER — Ambulatory Visit (INDEPENDENT_AMBULATORY_CARE_PROVIDER_SITE_OTHER): Payer: 59 | Admitting: Podiatry

## 2021-03-27 ENCOUNTER — Ambulatory Visit (INDEPENDENT_AMBULATORY_CARE_PROVIDER_SITE_OTHER): Payer: 59

## 2021-03-27 ENCOUNTER — Encounter: Payer: Self-pay | Admitting: Podiatry

## 2021-03-27 DIAGNOSIS — Z9889 Other specified postprocedural states: Secondary | ICD-10-CM

## 2021-03-27 LAB — SARS-COV-2 ANTIBODIES

## 2021-03-27 LAB — SPECIMEN STATUS REPORT

## 2021-03-27 NOTE — Progress Notes (Signed)
Subjective:   Patient ID: Renee Ayala, female   DOB: 50 y.o.   MRN: [id number]   HPI Patient states overall doing well with minimal pathology and walking with a good heel toe gait but has been quite active.  Admits that she did walk on this without wearing her boot to the bathroom at night   ROS      Objective:  Physical Exam  Neuro vascular status intact negative Bevelyn Buckles' sign noted with patient found to have good motion of the first MPJ but does have some swelling around the first and fifth metatarsal and admits that she is not been compliant with walking on her forefoot     Assessment:  Stress on her osteotomy from walking barefoot on her osteotomy sites     Plan:  H&P reviewed x-rays that there has been some stress on the osteotomies but with immobilization I do think will heal completely and I want her to continue with immobilization.  Patient overall will continue the same treatment plan and will be seen back in 4 weeks and will absolutely not walk on her foot without immobilization  X-rays indicate there is some stress on the first metatarsal osteotomy left foot fixation fourth and I do think will heal fine and the fifth metatarsal has migrated medially but I do believe it will heal in with secondary bone intent and there is no clinical pathology associated with it at this time but I want her to continue to stay immobilized

## 2021-04-12 ENCOUNTER — Other Ambulatory Visit: Payer: Self-pay

## 2021-04-12 DIAGNOSIS — F909 Attention-deficit hyperactivity disorder, unspecified type: Secondary | ICD-10-CM

## 2021-04-12 MED ORDER — AMPHETAMINE-DEXTROAMPHETAMINE 20 MG PO TABS: 20.0000 mg | ORAL_TABLET | Freq: Two times a day (BID) | ORAL | 0 refills | Status: DC

## 2021-04-24 ENCOUNTER — Encounter: Payer: Self-pay | Admitting: Podiatry

## 2021-04-24 ENCOUNTER — Ambulatory Visit (INDEPENDENT_AMBULATORY_CARE_PROVIDER_SITE_OTHER): Payer: 59 | Admitting: Podiatry

## 2021-04-24 ENCOUNTER — Other Ambulatory Visit: Payer: Self-pay

## 2021-04-24 ENCOUNTER — Ambulatory Visit (INDEPENDENT_AMBULATORY_CARE_PROVIDER_SITE_OTHER): Payer: 59

## 2021-04-24 DIAGNOSIS — Z9889 Other specified postprocedural states: Secondary | ICD-10-CM

## 2021-04-24 NOTE — Progress Notes (Signed)
Subjective:   Patient ID: Renee Ayala, female   DOB: 51 y.o.   MRN: [id number]   HPI Patient presents stating she is doing very well with her swelling reducing mild discomfort but controllable and is slowly trying to get back into shoe gear.  Patient has good range of motion and she is satisfied with this   ROS      Objective:  Physical Exam  Neurovascular status intact negative Bevelyn Buckles' sign noted wound edges coapted well hallux rectus position fifth metatarsal rectus position and good alignment noted with continued reduction of edema from previous evaluation     Assessment:  Doing well post osteotomy left first and fifth metatarsal with patient having been very active after surgery and developing some movement of bone     Plan:  H&P x-rays reviewed consolidation occurring and gradual increase in activity with patient slowly starting to wear shoe gear at this time.  Reappoint 8 weeks or earlier if any issues occur  X-rays indicate the osteotomy is healing well there is secondary bone healing around both the first and fifth metatarsal but I do believe they will consolidate at this position

## 2021-05-09 ENCOUNTER — Other Ambulatory Visit: Payer: Self-pay | Admitting: Physician Assistant

## 2021-05-09 DIAGNOSIS — F909 Attention-deficit hyperactivity disorder, unspecified type: Secondary | ICD-10-CM

## 2021-05-09 MED ORDER — AMPHETAMINE-DEXTROAMPHETAMINE 20 MG PO TABS: 20.0000 mg | ORAL_TABLET | Freq: Two times a day (BID) | ORAL | 0 refills | Status: DC

## 2021-05-09 NOTE — Progress Notes (Signed)
Renee Ayala

## 2021-05-10 ENCOUNTER — Other Ambulatory Visit: Payer: Self-pay

## 2021-05-10 DIAGNOSIS — F909 Attention-deficit hyperactivity disorder, unspecified type: Secondary | ICD-10-CM

## 2021-05-15 ENCOUNTER — Other Ambulatory Visit: Payer: Self-pay

## 2021-05-23 DIAGNOSIS — M9903 Segmental and somatic dysfunction of lumbar region: Secondary | ICD-10-CM | POA: Diagnosis not present

## 2021-05-23 DIAGNOSIS — M5441 Lumbago with sciatica, right side: Secondary | ICD-10-CM | POA: Diagnosis not present

## 2021-05-23 DIAGNOSIS — M461 Sacroiliitis, not elsewhere classified: Secondary | ICD-10-CM | POA: Diagnosis not present

## 2021-05-23 DIAGNOSIS — M9904 Segmental and somatic dysfunction of sacral region: Secondary | ICD-10-CM | POA: Diagnosis not present

## 2021-06-02 ENCOUNTER — Other Ambulatory Visit: Payer: Self-pay | Admitting: Physician Assistant

## 2021-06-02 DIAGNOSIS — Z1231 Encounter for screening mammogram for malignant neoplasm of breast: Secondary | ICD-10-CM

## 2021-06-13 ENCOUNTER — Other Ambulatory Visit: Payer: Self-pay

## 2021-06-13 DIAGNOSIS — F909 Attention-deficit hyperactivity disorder, unspecified type: Secondary | ICD-10-CM

## 2021-06-13 MED ORDER — AMPHETAMINE-DEXTROAMPHETAMINE 20 MG PO TABS: 20.0000 mg | ORAL_TABLET | Freq: Two times a day (BID) | ORAL | 0 refills | Status: DC

## 2021-06-17 ENCOUNTER — Other Ambulatory Visit: Payer: Self-pay | Admitting: Physician Assistant

## 2021-06-17 DIAGNOSIS — N3281 Overactive bladder: Secondary | ICD-10-CM

## 2021-06-26 ENCOUNTER — Ambulatory Visit (INDEPENDENT_AMBULATORY_CARE_PROVIDER_SITE_OTHER): Payer: 59 | Admitting: Podiatry

## 2021-06-26 ENCOUNTER — Other Ambulatory Visit: Payer: Self-pay

## 2021-06-26 ENCOUNTER — Ambulatory Visit (INDEPENDENT_AMBULATORY_CARE_PROVIDER_SITE_OTHER): Payer: 59

## 2021-06-26 DIAGNOSIS — Z9889 Other specified postprocedural states: Secondary | ICD-10-CM

## 2021-06-26 NOTE — Progress Notes (Signed)
Subjective:  ? ?Patient ID: Renee Ayala, female   DOB: 51 y.o.   MRN: [id number]  ? ?HPI ?Patient states she is doing excellent with minimal discomfort and states that the swelling has almost completely gone away and she is able to wear almost all of her shoes ? ? ?ROS ? ? ?   ?Objective:  ?Physical Exam  ?Neurovascular status intact negative Bevelyn Buckles' sign noted wound edges left healed well hallux in rectus position excellent range of motion no crepitus of the joint fifth metatarsal healing well with minimal swelling ? ?   ?Assessment:  ?Doing well post forefoot reconstruction left with some movement of bone secondary to excessive early activity but appears to be healing very well clinically 80 ? ?   ?Plan:  ?P final x-rays reviewed and patient's discharge and is allowed to return to normal activities.  Patient is encouraged to call questions concerns which may arise and this should be uneventful and healing perspective and continue to remodel as far as the bone goes with fixation ? ?X-rays indicate there has been some movement of the fifth and first metatarsal but they are stable at their position with multiple signs of healing and clinically doing beautifully with no pain or swelling ?   ? ? ?

## 2021-06-27 DIAGNOSIS — M9904 Segmental and somatic dysfunction of sacral region: Secondary | ICD-10-CM | POA: Diagnosis not present

## 2021-06-27 DIAGNOSIS — M9903 Segmental and somatic dysfunction of lumbar region: Secondary | ICD-10-CM | POA: Diagnosis not present

## 2021-06-27 DIAGNOSIS — M5441 Lumbago with sciatica, right side: Secondary | ICD-10-CM | POA: Diagnosis not present

## 2021-06-27 DIAGNOSIS — M461 Sacroiliitis, not elsewhere classified: Secondary | ICD-10-CM | POA: Diagnosis not present

## 2021-07-07 ENCOUNTER — Ambulatory Visit
Admission: RE | Admit: 2021-07-07 | Discharge: 2021-07-07 | Disposition: A | Discharge status: Home / Self Care | Payer: 59 | Source: Ambulatory Visit | Attending: Physician Assistant | Admitting: Physician Assistant

## 2021-07-07 ENCOUNTER — Other Ambulatory Visit: Payer: Self-pay

## 2021-07-07 DIAGNOSIS — Z1231 Encounter for screening mammogram for malignant neoplasm of breast: Secondary | ICD-10-CM | POA: Insufficient documentation

## 2021-07-14 ENCOUNTER — Other Ambulatory Visit: Payer: Self-pay

## 2021-07-14 DIAGNOSIS — F909 Attention-deficit hyperactivity disorder, unspecified type: Secondary | ICD-10-CM

## 2021-07-14 MED ORDER — AMPHETAMINE-DEXTROAMPHETAMINE 20 MG PO TABS: 20.0000 mg | ORAL_TABLET | Freq: Two times a day (BID) | ORAL | 0 refills | Status: DC

## 2021-07-17 ENCOUNTER — Other Ambulatory Visit: Payer: Self-pay

## 2021-07-17 DIAGNOSIS — F909 Attention-deficit hyperactivity disorder, unspecified type: Secondary | ICD-10-CM

## 2021-07-17 MED ORDER — AMPHETAMINE-DEXTROAMPHETAMINE 20 MG PO TABS: 20.0000 mg | ORAL_TABLET | Freq: Two times a day (BID) | ORAL | 0 refills | Status: DC

## 2021-07-17 NOTE — Telephone Encounter (Signed)
? ?  AMD ?

## 2021-07-25 DIAGNOSIS — M461 Sacroiliitis, not elsewhere classified: Secondary | ICD-10-CM | POA: Diagnosis not present

## 2021-07-25 DIAGNOSIS — M9904 Segmental and somatic dysfunction of sacral region: Secondary | ICD-10-CM | POA: Diagnosis not present

## 2021-07-25 DIAGNOSIS — M9903 Segmental and somatic dysfunction of lumbar region: Secondary | ICD-10-CM | POA: Diagnosis not present

## 2021-07-25 DIAGNOSIS — M5441 Lumbago with sciatica, right side: Secondary | ICD-10-CM | POA: Diagnosis not present

## 2021-07-31 ENCOUNTER — Ambulatory Visit: Payer: Self-pay

## 2021-07-31 DIAGNOSIS — Z0000 Encounter for general adult medical examination without abnormal findings: Secondary | ICD-10-CM

## 2021-07-31 LAB — POCT URINALYSIS DIPSTICK
Bilirubin, UA: NEGATIVE
Blood, UA: NEGATIVE
Glucose, UA: NEGATIVE
Ketones, UA: NEGATIVE
Leukocytes, UA: NEGATIVE
Nitrite, UA: NEGATIVE
Protein, UA: NEGATIVE
Spec Grav, UA: 1.015 (ref 1.010–1.025)
Urobilinogen, UA: 0.2 E.U./dL
pH, UA: 6 (ref 5.0–8.0)

## 2021-07-31 NOTE — Progress Notes (Signed)
Pt presents today for physical labs, will return to clinic for scheduled physical.  

## 2021-08-01 DIAGNOSIS — Z01411 Encounter for gynecological examination (general) (routine) with abnormal findings: Secondary | ICD-10-CM | POA: Diagnosis not present

## 2021-08-01 DIAGNOSIS — Z124 Encounter for screening for malignant neoplasm of cervix: Secondary | ICD-10-CM | POA: Diagnosis not present

## 2021-08-01 DIAGNOSIS — Z0142 Encounter for cervical smear to confirm findings of recent normal smear following initial abnormal smear: Secondary | ICD-10-CM | POA: Diagnosis not present

## 2021-08-01 DIAGNOSIS — Z6832 Body mass index (BMI) 32.0-32.9, adult: Secondary | ICD-10-CM | POA: Diagnosis not present

## 2021-08-01 DIAGNOSIS — R69 Illness, unspecified: Secondary | ICD-10-CM | POA: Diagnosis not present

## 2021-08-01 DIAGNOSIS — Z01419 Encounter for gynecological examination (general) (routine) without abnormal findings: Secondary | ICD-10-CM | POA: Diagnosis not present

## 2021-08-01 LAB — CMP12+LP+TP+TSH+6AC+CBC/D/PLT
ALT: 15 IU/L (ref 0–32)
AST: 22 IU/L (ref 0–40)
Albumin/Globulin Ratio: 1.9 (ref 1.2–2.2)
Albumin: 4.2 g/dL (ref 3.8–4.9)
Alkaline Phosphatase: 101 IU/L (ref 44–121)
BUN/Creatinine Ratio: 13 (ref 9–23)
BUN: 9 mg/dL (ref 6–24)
Basophils Absolute: 0.1 10*3/uL (ref 0.0–0.2)
Basos: 1 %
Bilirubin Total: 0.2 mg/dL (ref 0.0–1.2)
Calcium: 8.4 mg/dL — ABNORMAL LOW (ref 8.7–10.2)
Chloride: 107 mmol/L — ABNORMAL HIGH (ref 96–106)
Chol/HDL Ratio: 3.2 ratio (ref 0.0–4.4)
Cholesterol, Total: 197 mg/dL (ref 100–199)
Creatinine, Ser: 0.7 mg/dL (ref 0.57–1.00)
EOS (ABSOLUTE): 0.2 10*3/uL (ref 0.0–0.4)
Eos: 4 %
Estimated CHD Risk: <0.5 times avg. (ref 0.0–1.0)
Free Thyroxine Index: 1.6 (ref 1.2–4.9)
GGT: 13 IU/L (ref 0–60)
Globulin, Total: 2.2 g/dL (ref 1.5–4.5)
Glucose: 112 mg/dL — ABNORMAL HIGH (ref 70–99)
HDL: 61 mg/dL (ref >39)
Hematocrit: 40.1 % (ref 34.0–46.6)
Hemoglobin: 13.1 g/dL (ref 11.1–15.9)
Immature Grans (Abs): 0 10*3/uL (ref 0.0–0.1)
Immature Granulocytes: 0 %
Iron: 26 ug/dL — ABNORMAL LOW (ref 27–159)
LDH: 185 IU/L (ref 119–226)
LDL Chol Calc (NIH): 122 mg/dL — ABNORMAL HIGH (ref 0–99)
Lymphocytes Absolute: 2.6 10*3/uL (ref 0.7–3.1)
Lymphs: 54 %
MCH: 26.5 pg — ABNORMAL LOW (ref 26.6–33.0)
MCHC: 32.7 g/dL (ref 31.5–35.7)
MCV: 81 fL (ref 79–97)
Monocytes Absolute: 0.3 10*3/uL (ref 0.1–0.9)
Monocytes: 7 %
Neutrophils Absolute: 1.6 10*3/uL (ref 1.4–7.0)
Neutrophils: 34 %
Phosphorus: 3.7 mg/dL (ref 3.0–4.3)
Platelets: 270 10*3/uL (ref 150–450)
Potassium: 4.2 mmol/L (ref 3.5–5.2)
RBC: 4.94 x10E6/uL (ref 3.77–5.28)
RDW: 13.1 % (ref 11.7–15.4)
Sodium: 140 mmol/L (ref 134–144)
T3 Uptake Ratio: 28 % (ref 24–39)
T4, Total: 5.7 ug/dL (ref 4.5–12.0)
TSH: 2.34 u[IU]/mL (ref 0.450–4.500)
Total Protein: 6.4 g/dL (ref 6.0–8.5)
Triglycerides: 79 mg/dL (ref 0–149)
Uric Acid: 3.2 mg/dL (ref 3.0–7.2)
VLDL Cholesterol Cal: 14 mg/dL (ref 5–40)
WBC: 4.7 10*3/uL (ref 3.4–10.8)
eGFR: 105 mL/min/{1.73_m2} (ref >59)

## 2021-08-02 LAB — SPECIMEN STATUS REPORT

## 2021-08-02 LAB — HGB A1C W/O EAG: Hgb A1c MFr Bld: 6 % — ABNORMAL HIGH (ref 4.8–5.6)

## 2021-08-07 ENCOUNTER — Encounter: Payer: Self-pay | Admitting: Physician Assistant

## 2021-08-07 ENCOUNTER — Ambulatory Visit: Payer: Self-pay | Admitting: Physician Assistant

## 2021-08-07 VITALS — BP 132/86 | HR 85 | Temp 97.6°F | Resp 14 | Ht 62.0 in | Wt 175.0 lb

## 2021-08-07 DIAGNOSIS — Z0000 Encounter for general adult medical examination without abnormal findings: Secondary | ICD-10-CM

## 2021-08-07 DIAGNOSIS — Z780 Asymptomatic menopausal state: Secondary | ICD-10-CM

## 2021-08-07 DIAGNOSIS — F909 Attention-deficit hyperactivity disorder, unspecified type: Secondary | ICD-10-CM

## 2021-08-07 NOTE — Progress Notes (Signed)
Pt presents today to complete physical. Pt denies any issues or concerns at this time./CL,RMA 

## 2021-08-07 NOTE — Progress Notes (Signed)
? ?[facility] ? ?{____________________________________________ ? ? None  ?  (approximate) ? ?I have reviewed the triage vital signs and the nursing notes. ? ? ?HISTORY ? ?Chief Complaint ?Annual Exam ? ? ?HPI ?Renee Ayala is a 51 y.o. female patient presents for annual physical exam.  Patient voices no concerns or complaints.  Past medical history for ADHD.  Pre-clinic blood test for LH and FSH levels. ?   ? ?  ? ?{ ?Past Medical History:  ?Diagnosis Date  ? ADHD   ? Elevated blood pressure reading   ? Herpes simplex type 2 infection   ? History of dysplastic nevus 05/17/2004  ? left buttock  ? Over weight   ? ? ?Patient Active Problem List  ? Diagnosis Date Noted  ? UTI (urinary tract infection) 01/08/2019  ? ? ?Past Surgical History:  ?Procedure Laterality Date  ? ABLATION    ? CESAREAN SECTION    ? X3  ? LIPO SUCTION    ? TAMMY TUCK    ? ? ?Prior to Admission medications   ?Medication Sig Start Date End Date Taking? Authorizing Provider  ?amphetamine-dextroamphetamine (ADDERALL) 20 MG tablet Take 1 tablet (20 mg total) by mouth 2 (two) times daily. 07/17/21  Yes Sable Feil, PA-C  ?clobetasol (TEMOVATE) 0.05 % external solution Apply 1 application topically 2 (two) times daily. Mix whole bottle in 1 tub of cerave cream and use qd/bid itchy rash on arms, chest and back, avoid face, groin, axilla 02/07/21  Yes Brendolyn Patty, MD  ?fexofenadine-pseudoephedrine (ALLEGRA-D) 60-120 MG 12 hr tablet Take 1 tablet by mouth 2 (two) times daily. 03/22/21  Yes Sable Feil, PA-C  ?Halcinonide 0.1 % CREA APPLY A SMALL AMOUNT TO AFFECTED AREA TWICE A DAY AS NEEDED 02/03/19  Yes [provider]  ?hydrocortisone 2.5 % cream Apply topically 2 (two) times daily as needed (Rash). Apply to itchy rash under arms qd/bid until clear, then prn flares 05/23/20  Yes Brendolyn Patty, MD  ?meclizine (ANTIVERT) 25 MG tablet Take 1 tablet (25 mg total) by mouth 3 (three) times daily as needed for  dizziness or nausea. 02/21/21  Yes Sable Feil, PA-C  ?meloxicam (MOBIC) 15 MG tablet meloxicam 15 mg tablet   Yes [provider]  ?ondansetron (ZOFRAN) 4 MG tablet Take 1 tablet (4 mg total) by mouth every 8 (eight) hours as needed for nausea or vomiting. 02/27/21  Yes Wallene Huh, DPM  ?oxybutynin (DITROPAN-XL) 5 MG 24 hr tablet Take 1 tablet (5 mg total) by mouth at bedtime. 12/06/20  Yes Sable Feil, PA-C  ?valACYclovir (VALTREX) 500 MG tablet Take 500 mg by mouth daily.   Yes [provider]  ? ? ?Allergies ?Patient has no known allergies. ? ?Family History  ?Problem Relation Age of Onset  ? Breast cancer Neg Hx   ? ? ?Social History ?Social History  ? ?Tobacco Use  ? Smoking status: Never  ? Smokeless tobacco: Never  ?Substance Use Topics  ? Alcohol use: Yes  ?  Comment: socially  ? Drug use: No  ? ? ?Review of Systems ?Constitutional: No fever/chills ?Eyes: No visual changes. ?ENT: No sore throat. ?Cardiovascular: Denies chest pain. ?Respiratory: Denies shortness of breath. ?Gastrointestinal: No abdominal pain.  No nausea, no vomiting.  No diarrhea.  No constipation. ?Genitourinary: Negative for dysuria. ?Musculoskeletal: Negative for back pain. ?Skin: Negative for rash. ?Neurological: Negative for headaches, focal weakness or numbness. ?Psychiatric: ADHD ?____________________________________________ ? ? ?PHYSICAL  EXAM: ? ?VITAL SIGNS: Temperature 97.6, respiration 14, pulse 84, BP is 132/86, and patient 97% O2 sat on room air.  Patient with 175 pounds and BMI is 32.01. ?Constitutional: Alert and oriented. Well appearing and in no acute distress. ?Eyes: Conjunctivae are normal. PERRL. EOMI. ?Head: Atraumatic. ?Nose: No congestion/rhinnorhea. ?Mouth/Throat: Mucous membranes are moist.  Oropharynx non-erythematous. ?Neck: No stridor.  No cervical spine tenderness to palpation. ?Hematological/Lymphatic/Immunilogical: No cervical lymphadenopathy. ?Cardiovascular: Normal rate, regular  rhythm. Grossly normal heart sounds.  Good peripheral circulation. ?Respiratory: Normal respiratory effort.  No retractions. Lungs CTAB. ?Gastrointestinal: Soft and nontender. No distention. No abdominal bruits. No CVA tenderness. ?Genitourinary:  ?Musculoskeletal: No lower extremity tenderness nor edema.  No joint effusions. ?Neurologic:  Normal speech and language. No gross focal neurologic deficits are appreciated. No gait instability. ?Skin:  Skin is warm, dry and intact. No rash noted. ?Psychiatric: Mood and affect are normal. Speech and behavior are normal. ? ?____________________________________________ ?  ?LABS ?        ?Component Ref Range & Units 7 d ago ?(07/31/21) 4 mo ago ?(03/22/21) 1 yr ago ?(07/20/20) 1 yr ago ?(08/13/19) 2 yr ago ?(01/08/19)  ?Color, UA  yellow  Amber  Light Yellow  Yellow  Amber   ?Clarity, UA  clear  clear  Clear  Clear  Cloudy   ?Glucose, UA Negative Negative  Negative  Negative  Negative  Negative   ?Bilirubin, UA  negative  Negative  Negative  Negative  Negative   ?Ketones, UA  negative  Negative  Negative  Negative  Negative   ?Spec Grav, UA 1.010 - 1.025 1.015  1.025  1.025  >=1.030 Abnormal   >=1.030 Abnormal    ?Blood, UA  negative  Positive CM  Negative  Postive CM  3+ CM   ?pH, UA 5.0 - 8.0 6.0  6.0  6.0  5.0  5.5   ?Protein, UA Negative Negative  Negative  Negative  Negative  Positive Abnormal  CM   ?Urobilinogen, UA 0.2 or 1.0 E.U./dL 0.2  0.2  0.2  0.2  0.2   ?Nitrite, UA  negative  Negative  Negative  Negative  Positive   ?Leukocytes, UA Negative Negative  Negative  Negative  Negative  Small (1+) Abnormal  CM   ?Appearance   Clear         ?Odor   None         ?  ? ?  ?  ?Specimen Collected: 07/31/21 14:16 Last Resulted: 07/31/21 14:16  ?  ?    ?View Encounter Conversation    ?  ?  ?  ?  ? ?Other Results from 07/31/2021 ? ? Contains abnormal data CMP12+LP+TP+TSH+6AC+CBC/D/Plt ?Order: [id number] ?Status: Final result    ?Visible to patient: Yes (seen)    ?Next appt: 02/13/2022  at 09:30 AM in Dermatology Brendolyn Patty, MD)    ?Dx: Routine adult health maintenance    ?0 Result Notes ?       ?Component Ref Range & Units 7 d ago ?(07/31/21) 1 yr ago ?(07/20/20) 1 yr ago ?(08/13/19) 4 yr ago ?(07/30/17)  ?Glucose 70 - 99 mg/dL 112 High   91 R  105 High  R    ?Uric Acid 3.0 - 7.2 mg/dL 3.2  1.8 Low  R, CM  3.5 R, CM    ?Comment:            Therapeutic target for gout patients: <6.0  ?BUN 6 - 24 mg/dL $Remove'9  9  12    'OedRkVn$ ?  Creatinine, Ser 0.57 - 1.00 mg/dL 0.70  0.67  0.67    ?eGFR >59 mL/min/1.73 105  106     ?BUN/Creatinine Ratio 9 - $R'23 13  13  18    'XY$ ?Sodium 134 - 144 mmol/L 140  138  137    ?Potassium 3.5 - 5.2 mmol/L 4.2  4.1  4.1    ?Chloride 96 - 106 mmol/L 107 High   103  108 High     ?Calcium 8.7 - 10.2 mg/dL 8.4 Low   8.5 Low   8.2 Low     ?Phosphorus 3.0 - 4.3 mg/dL 3.7  3.7  3.3    ?Total Protein 6.0 - 8.5 g/dL 6.4  6.1  6.0    ?Albumin 3.8 - 4.9 g/dL 4.2  3.5 Low  R  3.9 R    ?Globulin, Total 1.5 - 4.5 g/dL 2.2  2.6  2.1    ?Albumin/Globulin Ratio 1.2 - 2.2 1.9  1.3  1.9    ?Bilirubin Total 0.0 - 1.2 mg/dL 0.2  0.3  0.5    ?Alkaline Phosphatase 44 - 121 IU/L 101  69  72 R    ?LDH 119 - 226 IU/L 185  175  176    ?AST 0 - 40 IU/L $Remov'22  24  20    'gWHTxy$ ?ALT 0 - 32 IU/L $Remov'15  22  17    'kTqSPe$ ?GGT 0 - 60 IU/L $Remov'13  12  20    'mUyXOO$ ?Iron 27 - 159 ug/dL 26 Low   57  77    ?Cholesterol, Total 100 - 199 mg/dL 197  151  152    ?Triglycerides 0 - 149 mg/dL 79  76  67  55 R   ?HDL >39 mg/dL 61  53  49  56 R   ?VLDL Cholesterol Cal 5 - 40 mg/dL $Remove'14  15  13    'aNOeyGN$ ?LDL Chol Calc (NIH) 0 - 99 mg/dL 122 High   83  90    ?Chol/HDL Ratio 0.0 - 4.4 ratio 3.2  2.8 CM  3.1 CM    ?Comment:                                   T. Chol/HDL Ratio  ?                                            Men  Women  ?                              1/2 Avg.Risk  3.4    3.3  ?                                  Avg.Risk  5.0    4.4  ?                               2X Avg.Risk  9.6    7.1  ?                               3X Avg.Risk 23.4  11.0   ?Estimated CHD Risk 0.0 -  1.0 times avg.  < 0.5   < 0.5 CM   < 0.5 CM    ?Comment: The CHD Risk is based on the T. Chol/HDL ratio. Other  ?factors affect CHD Risk such as hypertension, smoking,  ?diabetes, severe obesity, an

## 2021-08-08 DIAGNOSIS — M1811 Unilateral primary osteoarthritis of first carpometacarpal joint, right hand: Secondary | ICD-10-CM | POA: Diagnosis not present

## 2021-08-08 LAB — FSH/LH
FSH: 78.5 m[IU]/mL
LH: 47.8 m[IU]/mL

## 2021-08-11 ENCOUNTER — Other Ambulatory Visit: Payer: Self-pay | Admitting: Physician Assistant

## 2021-08-11 MED ORDER — SCOPOLAMINE 1 MG/3DAYS TD PT72: 1.0000 | MEDICATED_PATCH | TRANSDERMAL | 0 refills | Status: DC

## 2021-08-14 ENCOUNTER — Other Ambulatory Visit: Payer: Self-pay

## 2021-08-14 DIAGNOSIS — F909 Attention-deficit hyperactivity disorder, unspecified type: Secondary | ICD-10-CM | POA: Insufficient documentation

## 2021-08-14 DIAGNOSIS — Z1212 Encounter for screening for malignant neoplasm of rectum: Secondary | ICD-10-CM | POA: Diagnosis not present

## 2021-08-14 DIAGNOSIS — B009 Herpesviral infection, unspecified: Secondary | ICD-10-CM | POA: Insufficient documentation

## 2021-08-14 DIAGNOSIS — Z1211 Encounter for screening for malignant neoplasm of colon: Secondary | ICD-10-CM | POA: Diagnosis not present

## 2021-08-14 DIAGNOSIS — Z9889 Other specified postprocedural states: Secondary | ICD-10-CM | POA: Insufficient documentation

## 2021-08-14 DIAGNOSIS — I10 Essential (primary) hypertension: Secondary | ICD-10-CM | POA: Insufficient documentation

## 2021-08-14 MED ORDER — VALACYCLOVIR HCL 500 MG PO TABS: 500.0000 mg | ORAL_TABLET | Freq: Every day | ORAL | 2 refills | Status: DC

## 2021-08-14 MED ORDER — AMPHETAMINE-DEXTROAMPHETAMINE 20 MG PO TABS: 20.0000 mg | ORAL_TABLET | Freq: Two times a day (BID) | ORAL | 0 refills | Status: DC

## 2021-08-15 DIAGNOSIS — M9903 Segmental and somatic dysfunction of lumbar region: Secondary | ICD-10-CM | POA: Diagnosis not present

## 2021-08-15 DIAGNOSIS — M461 Sacroiliitis, not elsewhere classified: Secondary | ICD-10-CM | POA: Diagnosis not present

## 2021-08-15 DIAGNOSIS — M5441 Lumbago with sciatica, right side: Secondary | ICD-10-CM | POA: Diagnosis not present

## 2021-08-15 DIAGNOSIS — M9904 Segmental and somatic dysfunction of sacral region: Secondary | ICD-10-CM | POA: Diagnosis not present

## 2021-08-24 DIAGNOSIS — M5441 Lumbago with sciatica, right side: Secondary | ICD-10-CM | POA: Diagnosis not present

## 2021-08-24 DIAGNOSIS — M9903 Segmental and somatic dysfunction of lumbar region: Secondary | ICD-10-CM | POA: Diagnosis not present

## 2021-08-24 DIAGNOSIS — M9904 Segmental and somatic dysfunction of sacral region: Secondary | ICD-10-CM | POA: Diagnosis not present

## 2021-08-24 DIAGNOSIS — M461 Sacroiliitis, not elsewhere classified: Secondary | ICD-10-CM | POA: Diagnosis not present

## 2021-08-26 LAB — COLOGUARD: COLOGUARD: NEGATIVE

## 2021-09-19 ENCOUNTER — Other Ambulatory Visit: Payer: Self-pay

## 2021-09-19 DIAGNOSIS — F909 Attention-deficit hyperactivity disorder, unspecified type: Secondary | ICD-10-CM

## 2021-09-19 MED ORDER — AMPHETAMINE-DEXTROAMPHETAMINE 20 MG PO TABS: 20.0000 mg | ORAL_TABLET | Freq: Two times a day (BID) | ORAL | 0 refills | Status: DC

## 2021-09-28 DIAGNOSIS — M9904 Segmental and somatic dysfunction of sacral region: Secondary | ICD-10-CM | POA: Diagnosis not present

## 2021-09-28 DIAGNOSIS — M5441 Lumbago with sciatica, right side: Secondary | ICD-10-CM | POA: Diagnosis not present

## 2021-09-28 DIAGNOSIS — M9903 Segmental and somatic dysfunction of lumbar region: Secondary | ICD-10-CM | POA: Diagnosis not present

## 2021-09-28 DIAGNOSIS — M461 Sacroiliitis, not elsewhere classified: Secondary | ICD-10-CM | POA: Diagnosis not present

## 2021-10-10 ENCOUNTER — Other Ambulatory Visit: Payer: Self-pay

## 2021-10-10 DIAGNOSIS — N3281 Overactive bladder: Secondary | ICD-10-CM

## 2021-10-18 ENCOUNTER — Other Ambulatory Visit: Payer: Self-pay

## 2021-10-18 DIAGNOSIS — F909 Attention-deficit hyperactivity disorder, unspecified type: Secondary | ICD-10-CM

## 2021-10-19 MED ORDER — AMPHETAMINE-DEXTROAMPHETAMINE 20 MG PO TABS: 20.0000 mg | ORAL_TABLET | Freq: Two times a day (BID) | ORAL | 0 refills | Status: DC

## 2021-10-19 NOTE — Telephone Encounter (Signed)
UTD with office visits, Continue Rx 1 month LWLee PAC

## 2021-10-26 DIAGNOSIS — M5441 Lumbago with sciatica, right side: Secondary | ICD-10-CM | POA: Diagnosis not present

## 2021-10-26 DIAGNOSIS — M461 Sacroiliitis, not elsewhere classified: Secondary | ICD-10-CM | POA: Diagnosis not present

## 2021-10-26 DIAGNOSIS — M9904 Segmental and somatic dysfunction of sacral region: Secondary | ICD-10-CM | POA: Diagnosis not present

## 2021-11-17 ENCOUNTER — Other Ambulatory Visit: Payer: Self-pay

## 2021-11-17 DIAGNOSIS — F909 Attention-deficit hyperactivity disorder, unspecified type: Secondary | ICD-10-CM

## 2021-11-17 MED ORDER — AMPHETAMINE-DEXTROAMPHETAMINE 20 MG PO TABS: 20.0000 mg | ORAL_TABLET | Freq: Two times a day (BID) | ORAL | 0 refills | Status: DC

## 2021-11-23 DIAGNOSIS — M461 Sacroiliitis, not elsewhere classified: Secondary | ICD-10-CM | POA: Diagnosis not present

## 2021-11-23 DIAGNOSIS — M9904 Segmental and somatic dysfunction of sacral region: Secondary | ICD-10-CM | POA: Diagnosis not present

## 2021-11-23 DIAGNOSIS — M5441 Lumbago with sciatica, right side: Secondary | ICD-10-CM | POA: Diagnosis not present

## 2021-11-28 DIAGNOSIS — M1812 Unilateral primary osteoarthritis of first carpometacarpal joint, left hand: Secondary | ICD-10-CM | POA: Diagnosis not present

## 2021-11-30 ENCOUNTER — Encounter: Payer: Self-pay | Admitting: Physician Assistant

## 2021-11-30 ENCOUNTER — Ambulatory Visit: Payer: Self-pay | Admitting: Physician Assistant

## 2021-11-30 DIAGNOSIS — Z1152 Encounter for screening for COVID-19: Secondary | ICD-10-CM

## 2021-11-30 LAB — POC COVID19 BINAXNOW: SARS Coronavirus 2 Ag: POSITIVE — AB

## 2021-11-30 NOTE — Progress Notes (Signed)
   Subjective: Sinus congestion    Patient ID: Renee Ayala, female    DOB: [birth year], 51 y.o.   MRN: [id number]  HPI Patient complains of 3 days of sinus congestion.  Patient patient also states cough and postnasal drainage.  Patient denies fever, patient denies rash, recent travel or known contact with COVID-19.  Review of Systems ADHD and hypertension.    Objective:   Physical Exam Patient test positive for COVID-19.       Assessment & Plan: [id number]-[id number]   Patient given discharge isolation and care instructions for 5 days.

## 2021-11-30 NOTE — Progress Notes (Signed)
Pt informed of positive rapid covid test and educated with the current covid recommendations for 5 day isolation, treating symptoms and rest with hydration.

## 2021-11-30 NOTE — Progress Notes (Signed)
Pt presents today for Covid screening. Pt states she feels like she has a sinus infection, allergies no fever, cough and drainage 3 days.

## 2021-12-13 ENCOUNTER — Other Ambulatory Visit: Payer: Self-pay | Admitting: Dermatology

## 2021-12-13 DIAGNOSIS — L300 Nummular dermatitis: Secondary | ICD-10-CM

## 2021-12-20 DIAGNOSIS — M5441 Lumbago with sciatica, right side: Secondary | ICD-10-CM | POA: Diagnosis not present

## 2021-12-20 DIAGNOSIS — M9904 Segmental and somatic dysfunction of sacral region: Secondary | ICD-10-CM | POA: Diagnosis not present

## 2021-12-20 DIAGNOSIS — M9903 Segmental and somatic dysfunction of lumbar region: Secondary | ICD-10-CM | POA: Diagnosis not present

## 2021-12-20 DIAGNOSIS — M461 Sacroiliitis, not elsewhere classified: Secondary | ICD-10-CM | POA: Diagnosis not present

## 2021-12-21 ENCOUNTER — Other Ambulatory Visit: Payer: Self-pay

## 2021-12-21 DIAGNOSIS — F909 Attention-deficit hyperactivity disorder, unspecified type: Secondary | ICD-10-CM

## 2021-12-22 MED ORDER — AMPHETAMINE-DEXTROAMPHETAMINE 20 MG PO TABS: 20.0000 mg | ORAL_TABLET | Freq: Two times a day (BID) | ORAL | 0 refills | Status: DC

## 2022-01-02 DIAGNOSIS — M1812 Unilateral primary osteoarthritis of first carpometacarpal joint, left hand: Secondary | ICD-10-CM | POA: Diagnosis not present

## 2022-01-11 DIAGNOSIS — M9904 Segmental and somatic dysfunction of sacral region: Secondary | ICD-10-CM | POA: Diagnosis not present

## 2022-01-11 DIAGNOSIS — M9903 Segmental and somatic dysfunction of lumbar region: Secondary | ICD-10-CM | POA: Diagnosis not present

## 2022-01-11 DIAGNOSIS — M5441 Lumbago with sciatica, right side: Secondary | ICD-10-CM | POA: Diagnosis not present

## 2022-01-11 DIAGNOSIS — M461 Sacroiliitis, not elsewhere classified: Secondary | ICD-10-CM | POA: Diagnosis not present

## 2022-01-23 ENCOUNTER — Other Ambulatory Visit: Payer: Self-pay

## 2022-01-23 DIAGNOSIS — F909 Attention-deficit hyperactivity disorder, unspecified type: Secondary | ICD-10-CM

## 2022-01-24 MED ORDER — AMPHETAMINE-DEXTROAMPHETAMINE 20 MG PO TABS: 20.0000 mg | ORAL_TABLET | Freq: Two times a day (BID) | ORAL | 0 refills | Status: DC

## 2022-02-06 DIAGNOSIS — M5441 Lumbago with sciatica, right side: Secondary | ICD-10-CM | POA: Diagnosis not present

## 2022-02-06 DIAGNOSIS — M9903 Segmental and somatic dysfunction of lumbar region: Secondary | ICD-10-CM | POA: Diagnosis not present

## 2022-02-06 DIAGNOSIS — M9904 Segmental and somatic dysfunction of sacral region: Secondary | ICD-10-CM | POA: Diagnosis not present

## 2022-02-06 DIAGNOSIS — M461 Sacroiliitis, not elsewhere classified: Secondary | ICD-10-CM | POA: Diagnosis not present

## 2022-02-13 ENCOUNTER — Ambulatory Visit (INDEPENDENT_AMBULATORY_CARE_PROVIDER_SITE_OTHER): Payer: 59 | Admitting: Dermatology

## 2022-02-13 DIAGNOSIS — L814 Other melanin hyperpigmentation: Secondary | ICD-10-CM | POA: Diagnosis not present

## 2022-02-13 DIAGNOSIS — D229 Melanocytic nevi, unspecified: Secondary | ICD-10-CM

## 2022-02-13 DIAGNOSIS — Z1283 Encounter for screening for malignant neoplasm of skin: Secondary | ICD-10-CM

## 2022-02-13 DIAGNOSIS — L821 Other seborrheic keratosis: Secondary | ICD-10-CM | POA: Diagnosis not present

## 2022-02-13 DIAGNOSIS — L578 Other skin changes due to chronic exposure to nonionizing radiation: Secondary | ICD-10-CM | POA: Diagnosis not present

## 2022-02-13 DIAGNOSIS — Z86018 Personal history of other benign neoplasm: Secondary | ICD-10-CM

## 2022-02-13 DIAGNOSIS — D225 Melanocytic nevi of trunk: Secondary | ICD-10-CM

## 2022-02-13 DIAGNOSIS — D692 Other nonthrombocytopenic purpura: Secondary | ICD-10-CM

## 2022-02-13 DIAGNOSIS — L300 Nummular dermatitis: Secondary | ICD-10-CM | POA: Diagnosis not present

## 2022-02-13 MED ORDER — CLOBETASOL PROPIONATE 0.05 % EX SOLN: CUTANEOUS | 2 refills | Status: DC

## 2022-02-13 MED ORDER — EUCRISA 2 % EX OINT: TOPICAL_OINTMENT | CUTANEOUS | 3 refills | Status: DC

## 2022-02-13 NOTE — Patient Instructions (Addendum)
Start Eucrisa Ointment - Apply to affected areas rash once to twice daily until improved. May be used daily. Not a steroid.  Continue clobetasol/cerave mix - Apply to rash flares once to twice daily until improved. Avoid face, groin, underarms.  Topical steroids (such as triamcinolone, fluocinolone, fluocinonide, mometasone, clobetasol, halobetasol, betamethasone, hydrocortisone) can cause thinning and lightening of the skin if they are used for too long in the same area. Your physician has selected the right strength medicine for your problem and area affected on the body. Please use your medication only as directed by your physician to prevent side effects.     Melanoma ABCDEs  Melanoma is the most dangerous type of skin cancer, and is the leading cause of death from skin disease.  You are more likely to develop melanoma if you: Have light-colored skin, light-colored eyes, or red or blond hair Spend a lot of time in the sun Tan regularly, either outdoors or in a tanning bed Have had blistering sunburns, especially during childhood Have a close family member who has had a melanoma Have atypical moles or large birthmarks  Early detection of melanoma is key since treatment is typically straightforward and cure rates are extremely high if we catch it early.   The first sign of melanoma is often a change in a mole or a new dark spot.  The ABCDE system is a way of remembering the signs of melanoma.  A for asymmetry:  The two halves do not match. B for border:  The edges of the growth are irregular. C for color:  A mixture of colors are present instead of an even brown color. D for diameter:  Melanomas are usually (but not always) greater than 63m - the size of a pencil eraser. E for evolution:  The spot keeps changing in size, shape, and color.  Please check your skin once per month between visits. You can use a small mirror in front and a large mirror behind you to keep an eye on the back side  or your body.   If you see any new or changing lesions before your next follow-up, please call to schedule a visit.  Please continue daily skin protection including broad spectrum sunscreen SPF 30+ to sun-exposed areas, reapplying every 2 hours as needed when you're outdoors.   Staying in the shade or wearing long sleeves, sun glasses (UVA+UVB protection) and wide brim hats (4-inch brim around the entire circumference of the hat) are also recommended for sun protection.     Due to recent changes in healthcare laws, you may see results of your pathology and/or laboratory studies on MyChart before the doctors have had a chance to review them. We understand that in some cases there may be results that are confusing or concerning to you. Please understand that not all results are received at the same time and often the doctors may need to interpret multiple results in order to provide you with the best plan of care or course of treatment. Therefore, we ask that you please give u[place]2 business days to thoroughly review all your results before contacting the office for clarification. Should we see a critical lab result, you will be contacted sooner.   If You Need Anything After Your Visit  If you have any questions or concerns for your doctor, please call our main line at [phone] press option 4 to reach your doctor's medical assistant. If no one answers, please leave a voicemail as directed and we  will return your call as soon as possible. Messages left after 4 pm will be answered the following business day.   You may also send [place] a message via [place]. We typically respond to MyChart messages within 1-2 business days.  For prescription refills, please ask your pharmacy to contact our office. Our fax number is [phone].  If you have an urgent issue when the clinic is closed that cannot wait until the next business day, you can page your doctor at the number below.    Please note that while  we do our best to be available for urgent issues outside of office hours, we are not available 24/7.   If you have an urgent issue and are unable to reach [place], you may choose to seek medical care at your doctor's office, retail clinic, urgent care center, or emergency room.  If you have a medical emergency, please immediately call 911 or go to the emergency department.  Pager Numbers  - Dr. Nehemiah Massed: [phone]  - Dr. Laurence Ferrari: [phone]  - Dr. Nicole Kindred: [phone]  In the event of inclement weather, please call our main line at [phone] for an update on the status of any delays or closures.  Dermatology Medication Tips: Please keep the boxes that topical medications come in in order to help keep track of the instructions about where and how to use these. Pharmacies typically print the medication instructions only on the boxes and not directly on the medication tubes.   If your medication is too expensive, please contact our office at [phone] option 4 or send [place] a message through [place].   We are unable to tell what your co-pay for medications will be in advance as this is different depending on your insurance coverage. However, we may be able to find a substitute medication at lower cost or fill out paperwork to get insurance to cover a needed medication.   If a prior authorization is required to get your medication covered by your insurance company, please allow [place] 1-2 business days to complete this process.  Drug prices often vary depending on where the prescription is filled and some pharmacies may offer cheaper prices.  The website www.goodrx.com contains coupons for medications through different pharmacies. The prices here do not account for what the cost may be with help from insurance (it may be cheaper with your insurance), but the website can give you the price if you did not use any insurance.  - You can print the associated coupon and take it with your prescription  to the pharmacy.  - You may also stop by our office during regular business hours and pick up a GoodRx coupon card.  - If you need your prescription sent electronically to a different pharmacy, notify our office through [facility] or by phone at [phone] option 4.     Si Usted Necesita Algo Despus de Su Visita  Tambin puede enviarnos un mensaje a travs de Pharmacist, community. Por lo general respondemos a los mensajes de MyChart en el transcurso de 1 a 2 das hbiles.  Para renovar recetas, por favor pida a su farmacia que se ponga en contacto con nuestra oficina. Harland Dingwall de fax es [place] [phone].  Si tiene un asunto urgente cuando la clnica est cerrada y que no puede esperar hasta el siguiente da hbil, puede llamar/localizar a su doctor(a) al nmero que aparece a continuacin.   Por favor, tenga en cuenta que aunque hacemos todo lo posible para estar disponibles para  asuntos urgentes fuera del horario de [place], no estamos disponibles las 24 horas del da, los 7 das de la [employer].   Si tiene un problema urgente y no puede comunicarse con nosotros, puede optar por buscar atencin mdica  en el consultorio de su doctor(a), en una clnica privada, en un centro de atencin urgente o en una sala de emergencias.  Si tiene Engineering geologist, por favor llame inmediatamente al 911 o vaya a la sala de emergencias.  Nmeros de bper  - Dr. Nehemiah Massed: [phone]  - Dra. Moye: [phone]  - Dra. Nicole Kindred: [phone]  En caso de inclemencias del [place], por favor llame a Johnsie Kindred principal al [phone] para una actualizacin sobre el [place] de cualquier retraso o cierre.  Consejos para la medicacin en dermatologa: Por favor, guarde las cajas en las que vienen los medicamentos de uso tpico para ayudarle a seguir las instrucciones sobre dnde y cmo usarlos. Las farmacias generalmente imprimen las instrucciones del medicamento slo en las cajas y no directamente en  los tubos del [place].   Si su medicamento es muy caro, por favor, pngase en contacto con Zigmund Daniel llamando al [phone] y presione la opcin 4 o envenos un mensaje a travs de Pharmacist, community.   No podemos decirle cul ser su copago por los medicamentos por adelantado ya que esto es diferente dependiendo de la cobertura de su seguro. Sin embargo, es posible que podamos encontrar un medicamento sustituto a Electrical engineer un formulario para que el seguro cubra el medicamento que se considera necesario.   Si se requiere una autorizacin previa para que su compaa de seguros [place] su medicamento, por favor permtanos de 1 a 2 das hbiles para completar este proceso.  Los precios de los medicamentos varan con frecuencia dependiendo del Environmental consultant de dnde se surte la receta y alguna farmacias pueden ofrecer precios ms baratos.  El sitio web www.goodrx.com tiene cupones para medicamentos de Airline pilot. Los precios aqu no tienen en cuenta lo que podra costar con la ayuda del seguro (puede ser ms barato con su seguro), pero el sitio web puede darle el precio si no utiliz Research scientist (physical sciences).  - Puede imprimir el cupn correspondiente y llevarlo con su receta a la farmacia.  - Tambin puede pasar por nuestra oficina durante el horario de atencin regular y Charity fundraiser una tarjeta de cupones de GoodRx.  - Si necesita que su receta se enve electrnicamente a una farmacia diferente, informe a nuestra oficina a travs de MyChart de [place] o por telfono llamando al [phone] y presione la opcin 4.

## 2022-02-13 NOTE — Progress Notes (Signed)
Follow-Up Visit   Subjective  Renee Ayala is a 51 y.o. female who presents for the following: Annual Exam.  The patient presents for Total-Body Skin Exam (TBSE) for skin cancer screening and mole check.  The patient has spots, moles and lesions to be evaluated, some may be new or changing. She has nummular dermatitis and uses Clobetasol/CeraVe cream. No history of allergies. History of dysplastic nevus of the left buttock.    The following portions of the chart were reviewed this encounter and updated as appropriate:       Review of Systems:  No other skin or systemic complaints except as noted in HPI or Assessment and Plan.  Objective  Well appearing patient in no apparent distress; mood and affect are within normal limits.  A full examination was performed including scalp, head, eyes, ears, nose, lips, neck, chest, axillae, abdomen, back, buttocks, bilateral upper extremities, bilateral lower extremities, hands, feet, fingers, toes, fingernails, and toenails. All findings within normal limits unless otherwise noted below.  R spinal lower back 4.0 mm medium light brown speckled macule  buttocks, posterior hips Numerous regular brown macules.  arms, hands Pink scaly patches on the bilateral forearms, L ulnar palm and 5th finger.    Assessment & Plan  Skin cancer screening performed today.  Actinic Damage - chronic, secondary to cumulative UV radiation exposure/sun exposure over time - diffuse scaly erythematous macules with underlying dyspigmentation - Recommend daily broad spectrum sunscreen SPF 30+ to sun-exposed areas, reapply every 2 hours as needed.  - Recommend staying in the shade or wearing long sleeves, sun glasses (UVA+UVB protection) and wide brim hats (4-inch brim around the entire circumference of the hat). - Call for new or changing lesions.  History of Dysplastic Nevus - No evidence of recurrence today of the left buttock. - Recommend regular full body  skin exams - Recommend daily broad spectrum sunscreen SPF 30+ to sun-exposed areas, reapply every 2 hours as needed.  - Call if any new or changing lesions are noted between office visits  Lentigines - Scattered tan macules - Due to sun exposure - Benign-appearing, observe - Recommend daily broad spectrum sunscreen SPF 30+ to sun-exposed areas, reapply every 2 hours as needed. - Call for any changes  Seborrheic Keratoses - Stuck-on, waxy, tan-brown papules and/or plaques  - Benign-appearing - Discussed benign etiology and prognosis. - Observe - Call for any changes  Melanocytic Nevi - Tan-brown and/or pink-flesh-colored symmetric macules and papules - Benign appearing on exam today - Observation - Call clinic for new or changing moles - Recommend daily use of broad spectrum spf 30+ sunscreen to sun-exposed areas.   Purpura - Chronic; persistent and recurrent.  Treatable, but not curable. - Red, brown macule of the R mid distal nail plate, growing out per patient.  - Benign - Related to trauma, age, sun damage and/or use of blood thinners, chronic use of topical and/or oral steroids - Observe - Can use OTC arnica containing moisturizer such as Dermend Bruise Formula if desired - Call for worsening or other concerns  Nevus (2) buttocks, posterior hips; R spinal lower back  Benign-appearing, stable.  Observation.  Call clinic for new or changing moles.  Recommend daily use of broad spectrum spf 30+ sunscreen to sun-exposed areas.   Nummular dermatitis arms, hands  Vrs atopic derm. Chronic and persistent condition with duration or expected duration over one year. Condition is symptomatic / bothersome to patient. Not to goal.  Recommend mild soap and moisturizing  cream 1-2 times daily.  Gentle skin care handout provided.   Start Eucrisa Ointment Apply to AA rash QD/BID until rash improved dsp 60g 3Rf.  Continue Clobetasol sol/CeraVe mix Apply QD/BID to rash until improved  prn flares. Avoid face, groin, axilla. Dsp 74m 2Rf.  Discussed Dupixent injections if not improving with topicals.   Topical steroids (such as triamcinolone, fluocinolone, fluocinonide, mometasone, clobetasol, halobetasol, betamethasone, hydrocortisone) can cause thinning and lightening of the skin if they are used for too long in the same area. Your physician has selected the right strength medicine for your problem and area affected on the body. Please use your medication only as directed by your physician to prevent side effects.    Crisaborole (EUCRISA) 2 % OINT - arms, hands Apply to affected areas rash once to twice daily until improved.  Related Medications hydrocortisone 2.5 % cream Apply topically 2 (two) times daily as needed (Rash). Apply to itchy rash under arms qd/bid until clear, then prn flares  clobetasol (TEMOVATE) 0.05 % external solution MIX WHOLE BOTTLE IN 1 TUB OF CERAVE CREAM AND USE ONCE-TWICE DAILY FOR ITCHY RASH ON ARMS, CHEST AND BACK. AVOID FACE, GROIN AND AXILLA   Return in about 1 year (around 02/14/2023), or if symptoms worsen or fail to improve, for TBSE.  I,Jamesetta Orleans CMA, am acting as scribe for TBrendolyn Patty MD .  Documentation: I have reviewed the above documentation for accuracy and completeness, and I agree with the above.  TBrendolyn PattyMD

## 2022-02-22 ENCOUNTER — Other Ambulatory Visit: Payer: Self-pay

## 2022-02-22 DIAGNOSIS — F909 Attention-deficit hyperactivity disorder, unspecified type: Secondary | ICD-10-CM

## 2022-02-22 MED ORDER — AMPHETAMINE-DEXTROAMPHETAMINE 20 MG PO TABS: 20.0000 mg | ORAL_TABLET | Freq: Two times a day (BID) | ORAL | 0 refills | Status: DC

## 2022-03-06 DIAGNOSIS — M9903 Segmental and somatic dysfunction of lumbar region: Secondary | ICD-10-CM | POA: Diagnosis not present

## 2022-03-06 DIAGNOSIS — M5441 Lumbago with sciatica, right side: Secondary | ICD-10-CM | POA: Diagnosis not present

## 2022-03-06 DIAGNOSIS — M9904 Segmental and somatic dysfunction of sacral region: Secondary | ICD-10-CM | POA: Diagnosis not present

## 2022-03-06 DIAGNOSIS — M461 Sacroiliitis, not elsewhere classified: Secondary | ICD-10-CM | POA: Diagnosis not present

## 2022-03-07 ENCOUNTER — Ambulatory Visit (INDEPENDENT_AMBULATORY_CARE_PROVIDER_SITE_OTHER): Payer: 59

## 2022-03-07 ENCOUNTER — Ambulatory Visit (INDEPENDENT_AMBULATORY_CARE_PROVIDER_SITE_OTHER): Payer: 59 | Admitting: Podiatry

## 2022-03-07 DIAGNOSIS — M21619 Bunion of unspecified foot: Secondary | ICD-10-CM

## 2022-03-07 DIAGNOSIS — M21622 Bunionette of left foot: Secondary | ICD-10-CM | POA: Diagnosis not present

## 2022-03-07 DIAGNOSIS — S93402A Sprain of unspecified ligament of left ankle, initial encounter: Secondary | ICD-10-CM | POA: Diagnosis not present

## 2022-03-07 NOTE — Progress Notes (Signed)
  Subjective:  Patient ID: Renee Ayala, female    DOB: 12-Mar-1971,  MRN: [id number]  Chief Complaint  Patient presents with   Bunions    left foot injury-previous surgery with Dr. Paulla Dolly    51 y.o. female presents with the above complaint. History confirmed with patient.  She injured her foot and rolled it about a month ago and was worried that she injured the surgery correction.  She has a picture with bruising around the inside and outside of the ankle  Objective:  Physical Exam: warm, good capillary refill, no trophic changes or ulcerative lesions, normal DP and PT pulses, and normal sensory exam. Left Foot: normal exam, no swelling, tenderness, instability; ligaments intact, full range of motion of all ankle/foot joints Right Foot: normal exam, no swelling, tenderness, instability; ligaments intact, full range of motion of all ankle/foot joints  No images are attached to the encounter.  Radiographs: Multiple views x-ray of the left foot: no fracture, dislocation, swelling or degenerative changes noted and maintained surgical correction with no complication of hardware or surgical sites Assessment:   1. Sprain of left ankle, unspecified ligament, initial encounter   2. Bunion   3. Tailor's bunion of left foot      Plan:  Patient was evaluated and treated and all questions answered.  Likely has had a relatively mild ankle sprain seems to be resolving at this point.  Reviewed her x-rays.  No complication of surgical sites or hardware.  She will return as needed if it worsens  Return if symptoms worsen or fail to improve.

## 2022-03-27 ENCOUNTER — Other Ambulatory Visit: Payer: Self-pay

## 2022-03-27 DIAGNOSIS — F909 Attention-deficit hyperactivity disorder, unspecified type: Secondary | ICD-10-CM

## 2022-03-27 MED ORDER — AMPHETAMINE-DEXTROAMPHETAMINE 20 MG PO TABS: 20.0000 mg | ORAL_TABLET | Freq: Two times a day (BID) | ORAL | 0 refills | Status: DC

## 2022-04-03 DIAGNOSIS — M461 Sacroiliitis, not elsewhere classified: Secondary | ICD-10-CM | POA: Diagnosis not present

## 2022-04-03 DIAGNOSIS — M5441 Lumbago with sciatica, right side: Secondary | ICD-10-CM | POA: Diagnosis not present

## 2022-04-03 DIAGNOSIS — M9903 Segmental and somatic dysfunction of lumbar region: Secondary | ICD-10-CM | POA: Diagnosis not present

## 2022-04-03 DIAGNOSIS — M9904 Segmental and somatic dysfunction of sacral region: Secondary | ICD-10-CM | POA: Diagnosis not present

## 2022-04-12 ENCOUNTER — Other Ambulatory Visit: Payer: Self-pay

## 2022-04-12 DIAGNOSIS — B009 Herpesviral infection, unspecified: Secondary | ICD-10-CM

## 2022-04-12 MED ORDER — VALACYCLOVIR HCL 500 MG PO TABS: 500.0000 mg | ORAL_TABLET | Freq: Every day | ORAL | 2 refills | Status: DC

## 2022-04-17 DIAGNOSIS — Z01818 Encounter for other preprocedural examination: Secondary | ICD-10-CM | POA: Diagnosis not present

## 2022-04-20 DIAGNOSIS — M1812 Unilateral primary osteoarthritis of first carpometacarpal joint, left hand: Secondary | ICD-10-CM | POA: Diagnosis not present

## 2022-04-20 DIAGNOSIS — G8918 Other acute postprocedural pain: Secondary | ICD-10-CM | POA: Diagnosis not present

## 2022-05-03 DIAGNOSIS — Z4889 Encounter for other specified surgical aftercare: Secondary | ICD-10-CM | POA: Diagnosis not present

## 2022-05-03 DIAGNOSIS — M1812 Unilateral primary osteoarthritis of first carpometacarpal joint, left hand: Secondary | ICD-10-CM | POA: Diagnosis not present

## 2022-05-09 ENCOUNTER — Other Ambulatory Visit: Payer: Self-pay

## 2022-05-09 DIAGNOSIS — F909 Attention-deficit hyperactivity disorder, unspecified type: Secondary | ICD-10-CM

## 2022-05-09 MED ORDER — AMPHETAMINE-DEXTROAMPHETAMINE 20 MG PO TABS: 20.0000 mg | ORAL_TABLET | Freq: Two times a day (BID) | ORAL | 0 refills | Status: DC

## 2022-05-15 DIAGNOSIS — M9904 Segmental and somatic dysfunction of sacral region: Secondary | ICD-10-CM | POA: Diagnosis not present

## 2022-05-15 DIAGNOSIS — M5441 Lumbago with sciatica, right side: Secondary | ICD-10-CM | POA: Diagnosis not present

## 2022-05-15 DIAGNOSIS — M461 Sacroiliitis, not elsewhere classified: Secondary | ICD-10-CM | POA: Diagnosis not present

## 2022-05-15 DIAGNOSIS — M9903 Segmental and somatic dysfunction of lumbar region: Secondary | ICD-10-CM | POA: Diagnosis not present

## 2022-05-29 DIAGNOSIS — M1811 Unilateral primary osteoarthritis of first carpometacarpal joint, right hand: Secondary | ICD-10-CM | POA: Diagnosis not present

## 2022-06-12 ENCOUNTER — Other Ambulatory Visit: Payer: Self-pay | Admitting: Physician Assistant

## 2022-06-12 ENCOUNTER — Other Ambulatory Visit: Payer: Self-pay

## 2022-06-12 DIAGNOSIS — F909 Attention-deficit hyperactivity disorder, unspecified type: Secondary | ICD-10-CM

## 2022-06-12 DIAGNOSIS — M5441 Lumbago with sciatica, right side: Secondary | ICD-10-CM | POA: Diagnosis not present

## 2022-06-12 DIAGNOSIS — M9904 Segmental and somatic dysfunction of sacral region: Secondary | ICD-10-CM | POA: Diagnosis not present

## 2022-06-12 DIAGNOSIS — M461 Sacroiliitis, not elsewhere classified: Secondary | ICD-10-CM | POA: Diagnosis not present

## 2022-06-12 DIAGNOSIS — M9903 Segmental and somatic dysfunction of lumbar region: Secondary | ICD-10-CM | POA: Diagnosis not present

## 2022-06-12 MED ORDER — AMPHETAMINE-DEXTROAMPHETAMINE 20 MG PO TABS: 20.0000 mg | ORAL_TABLET | Freq: Two times a day (BID) | ORAL | 0 refills | Status: DC

## 2022-06-15 ENCOUNTER — Other Ambulatory Visit: Payer: Self-pay

## 2022-06-15 DIAGNOSIS — F909 Attention-deficit hyperactivity disorder, unspecified type: Secondary | ICD-10-CM

## 2022-06-15 MED ORDER — AMPHETAMINE-DEXTROAMPHETAMINE 20 MG PO TABS: 20.0000 mg | ORAL_TABLET | Freq: Two times a day (BID) | ORAL | 0 refills | Status: DC

## 2022-06-15 NOTE — Telephone Encounter (Signed)
[facility] e-prescription has been down over a week.  Robin, pharmacist called from [place] said medications like Adderall or Vicodin cannot be filled just using the printed copy from Epic.  Can write a Rx & give to patient to bring to pharmacy - needs a signature.  Advised to send Rx to [facility] or [facility].  Their e-prescription systems are not down and they can temporarily fill the Rx.  Contacted [place].  [facility] [place].  AMD

## 2022-06-27 ENCOUNTER — Ambulatory Visit: Payer: Self-pay

## 2022-06-27 ENCOUNTER — Encounter: Payer: 59 | Admitting: Physician Assistant

## 2022-06-27 DIAGNOSIS — Z0000 Encounter for general adult medical examination without abnormal findings: Secondary | ICD-10-CM

## 2022-06-27 LAB — POCT URINALYSIS DIPSTICK
Bilirubin, UA: NEGATIVE
Blood, UA: NEGATIVE
Glucose, UA: NEGATIVE
Ketones, UA: NEGATIVE
Leukocytes, UA: NEGATIVE
Nitrite, UA: NEGATIVE
Protein, UA: NEGATIVE
Spec Grav, UA: >=1.03 — AB (ref 1.010–1.025)
Urobilinogen, UA: 0.2 E.U./dL
pH, UA: 5.5 (ref 5.0–8.0)

## 2022-06-28 LAB — CMP12+LP+TP+TSH+6AC+CBC/D/PLT
ALT: 24 IU/L (ref 0–32)
AST: 29 IU/L (ref 0–40)
Albumin/Globulin Ratio: 1.8 (ref 1.2–2.2)
Albumin: 4.2 g/dL (ref 3.8–4.9)
Alkaline Phosphatase: 100 IU/L (ref 44–121)
BUN/Creatinine Ratio: 15 (ref 9–23)
BUN: 10 mg/dL (ref 6–24)
Basophils Absolute: 0 10*3/uL (ref 0.0–0.2)
Basos: 1 %
Bilirubin Total: 0.3 mg/dL (ref 0.0–1.2)
Calcium: 8.9 mg/dL (ref 8.7–10.2)
Chloride: 105 mmol/L (ref 96–106)
Chol/HDL Ratio: 2.9 ratio (ref 0.0–4.4)
Cholesterol, Total: 198 mg/dL (ref 100–199)
Creatinine, Ser: 0.68 mg/dL (ref 0.57–1.00)
EOS (ABSOLUTE): 0.2 10*3/uL (ref 0.0–0.4)
Eos: 4 %
Estimated CHD Risk: <0.5 times avg. (ref 0.0–1.0)
Free Thyroxine Index: 1.6 (ref 1.2–4.9)
GGT: 22 IU/L (ref 0–60)
Globulin, Total: 2.3 g/dL (ref 1.5–4.5)
Glucose: 106 mg/dL — ABNORMAL HIGH (ref 70–99)
HDL: 69 mg/dL (ref >39)
Hematocrit: 37.9 % (ref 34.0–46.6)
Hemoglobin: 12.1 g/dL (ref 11.1–15.9)
Immature Grans (Abs): 0 10*3/uL (ref 0.0–0.1)
Immature Granulocytes: 0 %
Iron: 29 ug/dL (ref 27–159)
LDH: 206 IU/L (ref 119–226)
LDL Chol Calc (NIH): 117 mg/dL — ABNORMAL HIGH (ref 0–99)
Lymphocytes Absolute: 2.1 10*3/uL (ref 0.7–3.1)
Lymphs: 45 %
MCH: 25.9 pg — ABNORMAL LOW (ref 26.6–33.0)
MCHC: 31.9 g/dL (ref 31.5–35.7)
MCV: 81 fL (ref 79–97)
Monocytes Absolute: 0.3 10*3/uL (ref 0.1–0.9)
Monocytes: 7 %
Neutrophils Absolute: 2 10*3/uL (ref 1.4–7.0)
Neutrophils: 43 %
Phosphorus: 4.2 mg/dL (ref 3.0–4.3)
Platelets: 307 10*3/uL (ref 150–450)
Potassium: 4.3 mmol/L (ref 3.5–5.2)
RBC: 4.68 x10E6/uL (ref 3.77–5.28)
RDW: 14.6 % (ref 11.7–15.4)
Sodium: 140 mmol/L (ref 134–144)
T3 Uptake Ratio: 27 % (ref 24–39)
T4, Total: 5.8 ug/dL (ref 4.5–12.0)
TSH: 1.86 u[IU]/mL (ref 0.450–4.500)
Total Protein: 6.5 g/dL (ref 6.0–8.5)
Triglycerides: 64 mg/dL (ref 0–149)
Uric Acid: 3.1 mg/dL (ref 3.0–7.2)
VLDL Cholesterol Cal: 12 mg/dL (ref 5–40)
WBC: 4.7 10*3/uL (ref 3.4–10.8)
eGFR: 105 mL/min/{1.73_m2} (ref >59)

## 2022-06-28 LAB — HGB A1C W/O EAG: Hgb A1c MFr Bld: 5.9 % — ABNORMAL HIGH (ref 4.8–5.6)

## 2022-07-04 ENCOUNTER — Ambulatory Visit: Payer: Self-pay | Admitting: Physician Assistant

## 2022-07-04 ENCOUNTER — Encounter: Payer: Self-pay | Admitting: Physician Assistant

## 2022-07-04 VITALS — BP 144/86 | Temp 97.6°F | Resp 12 | Ht 62.0 in | Wt 185.0 lb

## 2022-07-04 DIAGNOSIS — Z0000 Encounter for general adult medical examination without abnormal findings: Secondary | ICD-10-CM

## 2022-07-04 NOTE — Progress Notes (Signed)
Pt presents today to complete physical, Pt denies any issues or concerns a this time/CL,RMA

## 2022-07-04 NOTE — Progress Notes (Signed)
[facility]  ____________________________________________   None    (approximate)  I have reviewed the triage vital signs and the nursing notes.   HISTORY  Chief Complaint Annual Exam   HPI Renee Ayala is a 52 y.o. female patient presents for annual physical exam.  Patient voices no concerns or complaints.         Past Medical History:  Diagnosis Date   ADHD    Elevated blood pressure reading    Herpes simplex type 2 infection    History of dysplastic nevus 05/17/2004   left buttock   Over weight     Patient Active Problem List   Diagnosis Date Noted   Attention deficit hyperactivity disorder (ADHD) 08/14/2021   Essential hypertension 08/14/2021   Herpes simplex 08/14/2021   History of endometrial ablation 08/14/2021   UTI (urinary tract infection) 01/08/2019    Past Surgical History:  Procedure Laterality Date   ABLATION     CESAREAN SECTION     X3   LIPO SUCTION     TAMMY TUCK      Prior to Admission medications   Medication Sig Start Date End Date Taking? Authorizing Provider  amphetamine-dextroamphetamine (ADDERALL) 20 MG tablet Take 1 tablet (20 mg total) by mouth 2 (two) times daily. 06/15/22  Yes Sable Feil, PA-C  clobetasol (TEMOVATE) 0.05 % external solution MIX WHOLE BOTTLE IN 1 TUB OF CERAVE CREAM AND USE ONCE-TWICE DAILY FOR ITCHY RASH ON ARMS, CHEST AND BACK. AVOID FACE, GROIN AND AXILLA 02/13/22  Yes Brendolyn Patty, MD  Crisaborole (EUCRISA) 2 % OINT Apply to affected areas rash once to twice daily until improved. 02/13/22  Yes Brendolyn Patty, MD  Halcinonide 0.1 % CREA APPLY A SMALL AMOUNT TO AFFECTED AREA TWICE A DAY AS NEEDED 02/03/19  Yes [provider]  hydrocortisone 2.5 % cream Apply topically 2 (two) times daily as needed (Rash). Apply to itchy rash under arms qd/bid until clear, then prn flares 05/23/20  Yes Brendolyn Patty, MD  IBU 600 MG tablet Take 600 mg by mouth as needed for  headache.   Yes [provider]  meclizine (ANTIVERT) 25 MG tablet Take 1 tablet (25 mg total) by mouth 3 (three) times daily as needed for dizziness or nausea. 02/21/21  Yes Sable Feil, PA-C  meloxicam (MOBIC) 15 MG tablet meloxicam 15 mg tablet   Yes [provider]  ondansetron (ZOFRAN-ODT) 4 MG disintegrating tablet Take 4 mg by mouth as needed.   Yes [provider]  oxybutynin (DITROPAN-XL) 5 MG 24 hr tablet Take 1 tablet (5 mg total) by mouth at bedtime. 10/10/21  Yes Sable Feil, PA-C  valACYclovir (VALTREX) 500 MG tablet Take 1 tablet (500 mg total) by mouth daily. 04/12/22  Yes Sable Feil, PA-C    Allergies Patient has no known allergies.  Family History  Problem Relation Age of Onset   Breast cancer Neg Hx     Social History Social History   Tobacco Use   Smoking status: Never   Smokeless tobacco: Never  Substance Use Topics   Alcohol use: Yes    Comment: socially   Drug use: No    Review of Systems Constitutional: No fever/chills Eyes: No visual changes. ENT: No sore throat. Cardiovascular: Denies chest pain. Respiratory: Denies shortness of breath. Gastrointestinal: No abdominal pain.  No nausea, no vomiting.  No diarrhea.  No constipation. Genitourinary: Negative for dysuria. Musculoskeletal: Negative for back pain.  Skin: Negative for rash. Neurological: Negative for headaches, focal weakness or numbness. Psychiatric: ADHD  ____________________________________________   PHYSICAL EXAM:  VITAL SIGNS: BP is 144/86, respiration 12, temperature 97.6, and patient 99% O2 sat on room air.  Patient with 185 pounds and BMI is 33.84. Constitutional: Alert and oriented. Well appearing and in no acute distress. Eyes: Conjunctivae are normal. PERRL. EOMI. Head: Atraumatic. Nose: No congestion/rhinnorhea. Mouth/Throat: Mucous membranes are moist.  Oropharynx non-erythematous. Neck: No stridor.  No cervical spine tenderness to  palpation. Hematological/Lymphatic/Immunilogical: No cervical lymphadenopathy. Cardiovascular: Normal rate, regular rhythm. Grossly normal heart sounds.  Good peripheral circulation. Respiratory: Normal respiratory effort.  No retractions. Lungs CTAB. Gastrointestinal: Soft and nontender. No distention. No abdominal bruits. No CVA tenderness. Genitourinary: Deferred Musculoskeletal: No lower extremity tenderness nor edema.  No joint effusions. Neurologic:  Normal speech and language. No gross focal neurologic deficits are appreciated. No gait instability. Skin:  Skin is warm, dry and intact. No rash noted. Psychiatric: Mood and affect are normal. Speech and behavior are normal.  ____________________________________________   LABS _s         Component Ref Range & Units 7 d ago (06/27/22) 11 mo ago (07/31/21) 1 yr ago (03/22/21) 1 yr ago (07/20/20) 2 yr ago (08/13/19) 3 yr ago (01/08/19)  Color, UA Yellow yellow Amber Light Yellow Yellow Amber  Clarity, UA Clear clear clear Clear Clear Cloudy  Glucose, UA Negative Negative Negative Negative Negative Negative Negative  Bilirubin, UA Negative negative Negative Negative Negative Negative  Ketones, UA Negative negative Negative Negative Negative Negative  Spec Grav, UA 1.010 - 1.025 >=1.030 Abnormal  1.015 1.025 1.025 >=1.030 Abnormal  >=1.030 Abnormal   Blood, UA Negative negative Positive CM Negative Postive CM 3+ CM  pH, UA 5.0 - 8.0 5.5 6.0 6.0 6.0 5.0 5.5  Protein, UA Negative Negative Negative Negative Negative Negative Positive Abnormal  CM  Urobilinogen, UA 0.2 or 1.0 E.U./dL 0.2 0.2 0.2 0.2 0.2 0.2  Nitrite, UA Negative negative Negative Negative Negative Positive  Leukocytes, UA Negative Negative Negative Negative Negative Negative Small (1+) Abnormal  CM  Appearance   Clear     Odor   None                             Component Ref Range & Units 7 d ago 11 mo ago 1 yr ago 2 yr ago 4 yr ago  Glucose 70 - 99 mg/dL  106 High  112 High  91 R 105 High  R   Uric Acid 3.0 - 7.2 mg/dL 3.1 3.2 CM 1.8 Low  R, CM 3.5 R, CM   Comment:            Therapeutic target for gout patients: <6.0  BUN 6 - 24 mg/dL 10 9 9 12    Creatinine, Ser 0.57 - 1.00 mg/dL 0.68 0.70 0.67 0.67   eGFR >59 mL/min/1.73 105 105 106    BUN/Creatinine Ratio 9 - 23 15 13 13 18    Sodium 134 - 144 mmol/L 140 140 138 137   Potassium 3.5 - 5.2 mmol/L 4.3 4.2 4.1 4.1   Chloride 96 - 106 mmol/L 105 107 High  103 108 High    Calcium 8.7 - 10.2 mg/dL 8.9 8.4 Low  8.5 Low  8.2 Low    Phosphorus 3.0 - 4.3 mg/dL 4.2 3.7 3.7 3.3   Total Protein 6.0 - 8.5 g/dL 6.5 6.4 6.1 6.0   Albumin  3.8 - 4.9 g/dL 4.2 4.2 3.5 Low  R 3.9 R   Globulin, Total 1.5 - 4.5 g/dL 2.3 2.2 2.6 2.1   Albumin/Globulin Ratio 1.2 - 2.2 1.8 1.9 1.3 1.9   Bilirubin Total 0.0 - 1.2 mg/dL 0.3 0.2 0.3 0.5   Alkaline Phosphatase 44 - 121 IU/L 100 101 69 72 R   LDH 119 - 226 IU/L 206 185 175 176   AST 0 - 40 IU/L 29 22 24 20    ALT 0 - 32 IU/L 24 15 22 17    GGT 0 - 60 IU/L 22 13 12 20    Iron 27 - 159 ug/dL 29 26 Low  57 77   Cholesterol, Total 100 - 199 mg/dL 198 197 151 152   Triglycerides 0 - 149 mg/dL 64 79 76 67 55 R  HDL >39 mg/dL 69 61 53 49 56 R  VLDL Cholesterol Cal 5 - 40 mg/dL 12 14 15 13    LDL Chol Calc (NIH) 0 - 99 mg/dL 117 High  122 High  83 90   Chol/HDL Ratio 0.0 - 4.4 ratio 2.9 3.2 CM 2.8 CM 3.1 CM   Comment:                                   T. Chol/HDL Ratio                                             Men  Women                               1/2 Avg.Risk  3.4    3.3                                   Avg.Risk  5.0    4.4                                2X Avg.Risk  9.6    7.1                                3X Avg.Risk 23.4   11.0  Estimated CHD Risk 0.0 - 1.0 times avg.  < 0.5  < 0.5 CM  < 0.5 CM  < 0.5 CM   Comment: The CHD Risk is based on the T. Chol/HDL ratio. Other factors affect CHD Risk such as hypertension, smoking, diabetes,  severe obesity, and family history of premature CHD.  TSH 0.450 - 4.500 uIU/mL 1.860 2.340 2.300 1.750   T4, Total 4.5 - 12.0 ug/dL 5.8 5.7 6.8 5.8   T3 Uptake Ratio 24 - 39 % 27 28 32 28   Free Thyroxine Index 1.2 - 4.9 1.6 1.6 2.2 1.6   WBC 3.4 - 10.8 x10E3/uL 4.7 4.7 7.0 6.8   RBC 3.77 - 5.28 x10E6/uL 4.68 4.94 4.88 4.86   Hemoglobin 11.1 - 15.9 g/dL 12.1 13.1 14.2 14.4   Hematocrit 34.0 - 46.6 % 37.9 40.1 42.1 42.6   MCV 79 - 97 fL 81 81 86 88   MCH  26.6 - 33.0 pg 25.9 Low  26.5 Low  29.1 29.6   MCHC 31.5 - 35.7 g/dL 31.9 32.7 33.7 33.8   RDW 11.7 - 15.4 % 14.6 13.1 12.5 13.1   Platelets 150 - 450 x10E3/uL 307 270 215 190   Neutrophils Not Estab. % 43 34 49 50   Lymphs Not Estab. % 45 54 40 39   Monocytes Not Estab. % 7 7 7 8    Eos Not Estab. % 4 4 3 3    Basos Not Estab. % 1 1 1  0   Neutrophils Absolute 1.4 - 7.0 x10E3/uL 2.0 1.6 3.5 3.4   Lymphocytes Absolute 0.7 - 3.1 x10E3/uL 2.1 2.6 2.8 2.6   Monocytes Absolute 0.1 - 0.9 x10E3/uL 0.3 0.3 0.5 0.5   EOS (ABSOLUTE) 0.0 - 0.4 x10E3/uL 0.2 0.2 0.2 0.2   Basophils Absolute 0.0 - 0.2 x10E3/uL 0.0 0.1 0.0 0.0   Immature Granulocytes Not Estab. % 0 0 0 0              ___________________________________________  EKG  Normal sinus rhythm at 69 bpm ____________________________________________    ____________________________________________   INITIAL IMPRESSION / ASSESSMENT AND PLAN As part of my medical decision making, I reviewed the following data within the electronic MEDICAL RECORD NUMBER      No acute findings on physical exam, EKG, or labs.        ____________________________________________   FINAL CLINICAL IMPRESSION  Well exam   ED Discharge Orders     None        Note:  This document was prepared using Dragon voice recognition software and may include unintentional dictation errors.

## 2022-07-12 DIAGNOSIS — M9903 Segmental and somatic dysfunction of lumbar region: Secondary | ICD-10-CM | POA: Diagnosis not present

## 2022-07-12 DIAGNOSIS — M9904 Segmental and somatic dysfunction of sacral region: Secondary | ICD-10-CM | POA: Diagnosis not present

## 2022-07-12 DIAGNOSIS — M5441 Lumbago with sciatica, right side: Secondary | ICD-10-CM | POA: Diagnosis not present

## 2022-07-12 DIAGNOSIS — M461 Sacroiliitis, not elsewhere classified: Secondary | ICD-10-CM | POA: Diagnosis not present

## 2022-07-17 ENCOUNTER — Other Ambulatory Visit: Payer: Self-pay

## 2022-07-17 DIAGNOSIS — F909 Attention-deficit hyperactivity disorder, unspecified type: Secondary | ICD-10-CM

## 2022-07-17 MED ORDER — AMPHETAMINE-DEXTROAMPHETAMINE 20 MG PO TABS: 20.0000 mg | ORAL_TABLET | Freq: Two times a day (BID) | ORAL | 0 refills | Status: DC

## 2022-08-06 ENCOUNTER — Other Ambulatory Visit: Payer: Self-pay | Admitting: Physician Assistant

## 2022-08-06 DIAGNOSIS — N3281 Overactive bladder: Secondary | ICD-10-CM

## 2022-08-09 DIAGNOSIS — M5441 Lumbago with sciatica, right side: Secondary | ICD-10-CM | POA: Diagnosis not present

## 2022-08-09 DIAGNOSIS — M9904 Segmental and somatic dysfunction of sacral region: Secondary | ICD-10-CM | POA: Diagnosis not present

## 2022-08-09 DIAGNOSIS — M461 Sacroiliitis, not elsewhere classified: Secondary | ICD-10-CM | POA: Diagnosis not present

## 2022-08-09 DIAGNOSIS — M9903 Segmental and somatic dysfunction of lumbar region: Secondary | ICD-10-CM | POA: Diagnosis not present

## 2022-08-14 ENCOUNTER — Encounter: Payer: Self-pay | Admitting: Physician Assistant

## 2022-08-14 ENCOUNTER — Ambulatory Visit: Payer: Self-pay | Admitting: Physician Assistant

## 2022-08-14 VITALS — BP 129/77 | HR 88 | Temp 98.0°F | Resp 12 | Ht 62.0 in | Wt 189.0 lb

## 2022-08-14 DIAGNOSIS — L255 Unspecified contact dermatitis due to plants, except food: Secondary | ICD-10-CM

## 2022-08-14 MED ORDER — METHYLPREDNISOLONE 4 MG PO TBPK: ORAL_TABLET | ORAL | 0 refills | Status: DC

## 2022-08-14 MED ORDER — HYDROCORTISONE VALERATE 0.2 % EX CREA: 1.0000 | TOPICAL_CREAM | Freq: Two times a day (BID) | CUTANEOUS | 0 refills | Status: AC

## 2022-08-14 MED ORDER — HYDROXYZINE PAMOATE 25 MG PO CAPS: 25.0000 mg | ORAL_CAPSULE | Freq: Three times a day (TID) | ORAL | 0 refills | Status: DC | PRN

## 2022-08-14 MED ORDER — TRIAMCINOLONE ACETONIDE 40 MG/ML IJ SUSP
40.0000 mg | Freq: Once | INTRAMUSCULAR | Status: AC
  Administered 2022-08-14: 40 mg via INTRAMUSCULAR

## 2022-08-14 NOTE — Progress Notes (Signed)
For years has been going to [facility] for skin rashes.  Rash on legs, arms & neck - came out after doing yard work on Saturday  Rash itches  Has been taking Zyrtec the past two days & using OTC hydrocortisone cream.  Here to discuss possible allergy testing  No changes is hygiene products or cleaning products No changes in diet.  AMD

## 2022-08-14 NOTE — Progress Notes (Signed)
   Subjective: Rash    Patient ID: Renee Ayala, female    DOB: 28-Aug-1970, 52 y.o.   MRN: [id number]  HPI Patient developed rash performing yard work consisting of blowing pollen away from the home and automobiles.  Patient states moderate itching and small papular lesions on exposed part of body consistent of her lower extremities.  No respiratory distress.  No angioedema.  Patient has a history of undocumented  skin allergies.    Review of Systems ADHD and hypertension.    Objective:   Physical Exam BP 129/77  BP Location Left Arm  Patient Position Sitting  Cuff Size Large  Pulse 88  Resp 12  Temp 98 F (36.7 C)  Temp src Temporal  SpO2 97 %  Weight 189 lb (85.7 kg)  Height 5\' 2"  (1.575 m)  For exam revealed papular lesion on upper extremity with signs of excoriation.  No signs of secondary infection.      Assessment & Plan: Contact dermatitis  Patient given Kenalog 40 mg IM followed by original Dosepak and Atarax.  Patient follow-up in 1 week if no improvement or worsening complaint.

## 2022-08-16 ENCOUNTER — Other Ambulatory Visit: Payer: Self-pay

## 2022-08-16 DIAGNOSIS — F909 Attention-deficit hyperactivity disorder, unspecified type: Secondary | ICD-10-CM

## 2022-08-16 MED ORDER — AMPHETAMINE-DEXTROAMPHETAMINE 20 MG PO TABS: 20.0000 mg | ORAL_TABLET | Freq: Two times a day (BID) | ORAL | 0 refills | Status: DC

## 2022-08-23 DIAGNOSIS — Z01419 Encounter for gynecological examination (general) (routine) without abnormal findings: Secondary | ICD-10-CM | POA: Diagnosis not present

## 2022-08-23 DIAGNOSIS — Z01411 Encounter for gynecological examination (general) (routine) with abnormal findings: Secondary | ICD-10-CM | POA: Diagnosis not present

## 2022-08-23 DIAGNOSIS — Z124 Encounter for screening for malignant neoplasm of cervix: Secondary | ICD-10-CM | POA: Diagnosis not present

## 2022-08-23 DIAGNOSIS — Z113 Encounter for screening for infections with a predominantly sexual mode of transmission: Secondary | ICD-10-CM | POA: Diagnosis not present

## 2022-09-13 DIAGNOSIS — M9903 Segmental and somatic dysfunction of lumbar region: Secondary | ICD-10-CM | POA: Diagnosis not present

## 2022-09-13 DIAGNOSIS — M9904 Segmental and somatic dysfunction of sacral region: Secondary | ICD-10-CM | POA: Diagnosis not present

## 2022-09-13 DIAGNOSIS — M461 Sacroiliitis, not elsewhere classified: Secondary | ICD-10-CM | POA: Diagnosis not present

## 2022-09-13 DIAGNOSIS — M5441 Lumbago with sciatica, right side: Secondary | ICD-10-CM | POA: Diagnosis not present

## 2022-09-17 ENCOUNTER — Other Ambulatory Visit: Payer: Self-pay | Admitting: Physician Assistant

## 2022-09-17 DIAGNOSIS — Z0000 Encounter for general adult medical examination without abnormal findings: Secondary | ICD-10-CM

## 2022-09-18 ENCOUNTER — Other Ambulatory Visit: Payer: Self-pay

## 2022-09-18 DIAGNOSIS — F909 Attention-deficit hyperactivity disorder, unspecified type: Secondary | ICD-10-CM

## 2022-09-19 MED ORDER — AMPHETAMINE-DEXTROAMPHETAMINE 20 MG PO TABS
20.0000 mg | ORAL_TABLET | Freq: Two times a day (BID) | ORAL | 0 refills | Status: DC
Start: 2022-09-19 — End: 2022-10-22

## 2022-09-27 ENCOUNTER — Other Ambulatory Visit: Payer: Self-pay

## 2022-09-27 DIAGNOSIS — Z87898 Personal history of other specified conditions: Secondary | ICD-10-CM

## 2022-09-27 MED ORDER — SCOPOLAMINE 1 MG/3DAYS TD PT72
1.0000 | MEDICATED_PATCH | TRANSDERMAL | 0 refills | Status: DC
Start: 2022-09-27 — End: 2022-12-18

## 2022-10-11 DIAGNOSIS — M5441 Lumbago with sciatica, right side: Secondary | ICD-10-CM | POA: Diagnosis not present

## 2022-10-11 DIAGNOSIS — M9903 Segmental and somatic dysfunction of lumbar region: Secondary | ICD-10-CM | POA: Diagnosis not present

## 2022-10-11 DIAGNOSIS — M9904 Segmental and somatic dysfunction of sacral region: Secondary | ICD-10-CM | POA: Diagnosis not present

## 2022-10-11 DIAGNOSIS — M461 Sacroiliitis, not elsewhere classified: Secondary | ICD-10-CM | POA: Diagnosis not present

## 2022-10-22 ENCOUNTER — Other Ambulatory Visit: Payer: Self-pay

## 2022-10-22 DIAGNOSIS — F909 Attention-deficit hyperactivity disorder, unspecified type: Secondary | ICD-10-CM

## 2022-10-22 MED ORDER — AMPHETAMINE-DEXTROAMPHETAMINE 20 MG PO TABS
20.0000 mg | ORAL_TABLET | Freq: Two times a day (BID) | ORAL | 0 refills | Status: DC
Start: 2022-10-22 — End: 2022-11-21

## 2022-11-05 ENCOUNTER — Ambulatory Visit
Admission: RE | Admit: 2022-11-05 | Discharge: 2022-11-05 | Disposition: A | Discharge status: Home / Self Care | Payer: 59 | Source: Ambulatory Visit | Attending: Physician Assistant | Admitting: Physician Assistant

## 2022-11-05 DIAGNOSIS — M5441 Lumbago with sciatica, right side: Secondary | ICD-10-CM | POA: Diagnosis not present

## 2022-11-05 DIAGNOSIS — Z0000 Encounter for general adult medical examination without abnormal findings: Secondary | ICD-10-CM

## 2022-11-05 DIAGNOSIS — M9903 Segmental and somatic dysfunction of lumbar region: Secondary | ICD-10-CM | POA: Diagnosis not present

## 2022-11-05 DIAGNOSIS — Z1231 Encounter for screening mammogram for malignant neoplasm of breast: Secondary | ICD-10-CM | POA: Diagnosis not present

## 2022-11-05 DIAGNOSIS — M9904 Segmental and somatic dysfunction of sacral region: Secondary | ICD-10-CM | POA: Diagnosis not present

## 2022-11-05 DIAGNOSIS — M461 Sacroiliitis, not elsewhere classified: Secondary | ICD-10-CM | POA: Diagnosis not present

## 2022-11-07 ENCOUNTER — Other Ambulatory Visit: Payer: Self-pay

## 2022-11-07 DIAGNOSIS — N3281 Overactive bladder: Secondary | ICD-10-CM

## 2022-11-07 MED ORDER — OXYBUTYNIN CHLORIDE ER 5 MG PO TB24
5.0000 mg | ORAL_TABLET | Freq: Every day | ORAL | 3 refills | Status: DC
Start: 2022-11-07 — End: 2023-11-26

## 2022-11-09 ENCOUNTER — Ambulatory Visit: Payer: 59

## 2022-11-21 ENCOUNTER — Other Ambulatory Visit: Payer: Self-pay

## 2022-11-21 DIAGNOSIS — F909 Attention-deficit hyperactivity disorder, unspecified type: Secondary | ICD-10-CM

## 2022-11-21 MED ORDER — AMPHETAMINE-DEXTROAMPHETAMINE 20 MG PO TABS
20.0000 mg | ORAL_TABLET | Freq: Two times a day (BID) | ORAL | 0 refills | Status: DC
Start: 2022-11-21 — End: 2022-12-24

## 2022-12-06 DIAGNOSIS — M461 Sacroiliitis, not elsewhere classified: Secondary | ICD-10-CM | POA: Diagnosis not present

## 2022-12-06 DIAGNOSIS — M9903 Segmental and somatic dysfunction of lumbar region: Secondary | ICD-10-CM | POA: Diagnosis not present

## 2022-12-06 DIAGNOSIS — M5441 Lumbago with sciatica, right side: Secondary | ICD-10-CM | POA: Diagnosis not present

## 2022-12-06 DIAGNOSIS — M9904 Segmental and somatic dysfunction of sacral region: Secondary | ICD-10-CM | POA: Diagnosis not present

## 2022-12-18 ENCOUNTER — Encounter: Payer: Self-pay | Admitting: Physician Assistant

## 2022-12-18 ENCOUNTER — Ambulatory Visit: Payer: Self-pay | Admitting: Physician Assistant

## 2022-12-18 VITALS — BP 131/87 | HR 97 | Temp 98.2°F | Resp 12 | Ht 62.0 in | Wt 193.0 lb

## 2022-12-18 DIAGNOSIS — R1319 Other dysphagia: Secondary | ICD-10-CM

## 2022-12-18 DIAGNOSIS — N83209 Unspecified ovarian cyst, unspecified side: Secondary | ICD-10-CM | POA: Insufficient documentation

## 2022-12-18 DIAGNOSIS — A6000 Herpesviral infection of urogenital system, unspecified: Secondary | ICD-10-CM | POA: Insufficient documentation

## 2022-12-18 DIAGNOSIS — D069 Carcinoma in situ of cervix, unspecified: Secondary | ICD-10-CM | POA: Insufficient documentation

## 2022-12-18 DIAGNOSIS — N920 Excessive and frequent menstruation with regular cycle: Secondary | ICD-10-CM | POA: Insufficient documentation

## 2022-12-18 NOTE — Progress Notes (Signed)
States ongoing for past couple of years but progressively getting worse. When eating certain foods like bread, noodles or rice, feels like they are getting stuck in the epigastric area. States increasing fluids doesn't help push it down. Denies issues with gastric reflux.   States occ TUMS.  Requesting GI Consult  AMD

## 2022-12-18 NOTE — Progress Notes (Signed)
   Subjective:Dysphagia    Patient ID: Renee Ayala, female    DOB: July 21, 1970, 52 y.o.   MRN: [id number]  HPI Patient complains of 3 years of progressive dysphagia.  Meals that contain bread, noodles, rice feels "struck" in mid esophageal area.  Patient is able to tolerate fluids.  Denies history of GERD.   Review of Systems ADHD and Hypertension    Objective:   Physical Exam Weight 193 lb (87.5 kg)  Height 5\' 2"  (1.575 m)   BMI 35.30 kg/m2  BSA 1.96 m2   Physical exam was deferred.      Assessment & Plan:Dysphagia  I will order an barium swallow and consideration for consult to gastroenterologist.

## 2022-12-24 ENCOUNTER — Other Ambulatory Visit: Payer: Self-pay

## 2022-12-24 ENCOUNTER — Ambulatory Visit
Admission: RE | Admit: 2022-12-24 | Discharge: 2022-12-24 | Disposition: A | Discharge status: Home / Self Care | Payer: 59 | Source: Ambulatory Visit | Attending: Physician Assistant | Admitting: Physician Assistant

## 2022-12-24 DIAGNOSIS — K219 Gastro-esophageal reflux disease without esophagitis: Secondary | ICD-10-CM | POA: Diagnosis not present

## 2022-12-24 DIAGNOSIS — R1319 Other dysphagia: Secondary | ICD-10-CM | POA: Insufficient documentation

## 2022-12-24 DIAGNOSIS — R1310 Dysphagia, unspecified: Secondary | ICD-10-CM | POA: Diagnosis not present

## 2022-12-24 DIAGNOSIS — F909 Attention-deficit hyperactivity disorder, unspecified type: Secondary | ICD-10-CM

## 2022-12-24 MED ORDER — AMPHETAMINE-DEXTROAMPHETAMINE 20 MG PO TABS
20.0000 mg | ORAL_TABLET | Freq: Two times a day (BID) | ORAL | 0 refills | Status: DC
Start: 2022-12-24 — End: 2023-01-21

## 2022-12-24 NOTE — Addendum Note (Signed)
Addended by: Gardner Candle on: 12/24/2022 04:05 PM   Modules accepted: Orders

## 2022-12-27 DIAGNOSIS — M9904 Segmental and somatic dysfunction of sacral region: Secondary | ICD-10-CM | POA: Diagnosis not present

## 2022-12-27 DIAGNOSIS — M461 Sacroiliitis, not elsewhere classified: Secondary | ICD-10-CM | POA: Diagnosis not present

## 2022-12-27 DIAGNOSIS — M9903 Segmental and somatic dysfunction of lumbar region: Secondary | ICD-10-CM | POA: Diagnosis not present

## 2022-12-27 DIAGNOSIS — M5441 Lumbago with sciatica, right side: Secondary | ICD-10-CM | POA: Diagnosis not present

## 2023-01-21 ENCOUNTER — Encounter: Payer: Self-pay | Admitting: Physician Assistant

## 2023-01-21 ENCOUNTER — Other Ambulatory Visit: Payer: Self-pay

## 2023-01-21 ENCOUNTER — Ambulatory Visit: Payer: Self-pay | Admitting: Physician Assistant

## 2023-01-21 VITALS — BP 135/91 | HR 96 | Resp 16 | Ht 62.0 in | Wt 190.0 lb

## 2023-01-21 DIAGNOSIS — F909 Attention-deficit hyperactivity disorder, unspecified type: Secondary | ICD-10-CM

## 2023-01-21 DIAGNOSIS — Z79899 Other long term (current) drug therapy: Secondary | ICD-10-CM

## 2023-01-21 MED ORDER — AMPHETAMINE-DEXTROAMPHETAMINE 20 MG PO TABS: 20.0000 mg | ORAL_TABLET | Freq: Two times a day (BID) | ORAL | 0 refills | Status: DC

## 2023-01-21 NOTE — Progress Notes (Signed)
   Subjective: Medication refill    Patient ID: Renee Ayala, female    DOB: [birth year], 52 y.o.   MRN: [id number]  HPI Patient here for medication refill.  Patient takes Adderall 20 mg twice daily.  Patient states no complaints of medication.  Patient is compliant.   Review of Systems ADHD and hypertension.    Objective:   Physical Exam BP 135/91  Pulse 96  Resp 16  SpO2 98 %  Weight 190 lb (86.2 kg)  Height 5\' 2"  (1.575 m   BMI 34.75 kg/m2  BSA 1.94 m2         Assessment & Plan: Medication refill  Patient prescription for Adderall was renewed.  Patient to follow-up as directed.

## 2023-01-21 NOTE — Progress Notes (Signed)
Pt presents today for Rx refill Adderall 20mg 

## 2023-01-25 DIAGNOSIS — M542 Cervicalgia: Secondary | ICD-10-CM | POA: Diagnosis not present

## 2023-01-31 DIAGNOSIS — M9904 Segmental and somatic dysfunction of sacral region: Secondary | ICD-10-CM | POA: Diagnosis not present

## 2023-01-31 DIAGNOSIS — M9903 Segmental and somatic dysfunction of lumbar region: Secondary | ICD-10-CM | POA: Diagnosis not present

## 2023-01-31 DIAGNOSIS — M461 Sacroiliitis, not elsewhere classified: Secondary | ICD-10-CM | POA: Diagnosis not present

## 2023-01-31 DIAGNOSIS — M5441 Lumbago with sciatica, right side: Secondary | ICD-10-CM | POA: Diagnosis not present

## 2023-02-18 DIAGNOSIS — M542 Cervicalgia: Secondary | ICD-10-CM | POA: Diagnosis not present

## 2023-02-22 ENCOUNTER — Other Ambulatory Visit: Payer: Self-pay

## 2023-02-22 DIAGNOSIS — F909 Attention-deficit hyperactivity disorder, unspecified type: Secondary | ICD-10-CM

## 2023-02-22 MED ORDER — AMPHETAMINE-DEXTROAMPHETAMINE 20 MG PO TABS
20.0000 mg | ORAL_TABLET | Freq: Two times a day (BID) | ORAL | 0 refills | Status: DC
Start: 2023-02-22 — End: 2023-03-25

## 2023-02-26 ENCOUNTER — Encounter: Payer: 59 | Admitting: Dermatology

## 2023-02-26 DIAGNOSIS — M542 Cervicalgia: Secondary | ICD-10-CM | POA: Insufficient documentation

## 2023-03-05 ENCOUNTER — Ambulatory Visit: Payer: 59 | Admitting: Dermatology

## 2023-03-05 ENCOUNTER — Encounter: Payer: Self-pay | Admitting: Dermatology

## 2023-03-05 DIAGNOSIS — Z1283 Encounter for screening for malignant neoplasm of skin: Secondary | ICD-10-CM | POA: Diagnosis not present

## 2023-03-05 DIAGNOSIS — Z86018 Personal history of other benign neoplasm: Secondary | ICD-10-CM

## 2023-03-05 DIAGNOSIS — L814 Other melanin hyperpigmentation: Secondary | ICD-10-CM | POA: Diagnosis not present

## 2023-03-05 DIAGNOSIS — L209 Atopic dermatitis, unspecified: Secondary | ICD-10-CM

## 2023-03-05 DIAGNOSIS — D1801 Hemangioma of skin and subcutaneous tissue: Secondary | ICD-10-CM

## 2023-03-05 DIAGNOSIS — L300 Nummular dermatitis: Secondary | ICD-10-CM

## 2023-03-05 DIAGNOSIS — L820 Inflamed seborrheic keratosis: Secondary | ICD-10-CM | POA: Diagnosis not present

## 2023-03-05 DIAGNOSIS — W908XXA Exposure to other nonionizing radiation, initial encounter: Secondary | ICD-10-CM | POA: Diagnosis not present

## 2023-03-05 DIAGNOSIS — L578 Other skin changes due to chronic exposure to nonionizing radiation: Secondary | ICD-10-CM | POA: Diagnosis not present

## 2023-03-05 DIAGNOSIS — L821 Other seborrheic keratosis: Secondary | ICD-10-CM

## 2023-03-05 DIAGNOSIS — D229 Melanocytic nevi, unspecified: Secondary | ICD-10-CM

## 2023-03-05 MED ORDER — PIMECROLIMUS 1 % EX CREA
TOPICAL_CREAM | Freq: Two times a day (BID) | CUTANEOUS | 5 refills | Status: DC
Start: 2023-03-05 — End: 2023-09-10

## 2023-03-05 NOTE — Patient Instructions (Addendum)

## 2023-03-05 NOTE — Progress Notes (Signed)
Follow-Up Visit   Subjective  Renee Ayala is a 52 y.o. female who presents for the following: Skin Cancer Screening and Full Body Skin Exam  The patient presents for Total-Body Skin Exam (TBSE) for skin cancer screening and mole check. The patient has spots, moles and lesions to be evaluated, some may be new or changing and the patient may have concern these could be cancer.  Hx dysplastic nevus.   The following portions of the chart were reviewed this encounter and updated as appropriate: medications, allergies, medical history  Review of Systems:  No other skin or systemic complaints except as noted in HPI or Assessment and Plan.  Objective  Well appearing patient in no apparent distress; mood and affect are within normal limits.  A full examination was performed including scalp, head, eyes, ears, nose, lips, neck, chest, axillae, abdomen, back, buttocks, bilateral upper extremities, bilateral lower extremities, hands, feet, fingers, toes, fingernails, and toenails. All findings within normal limits unless otherwise noted below.   Relevant physical exam findings are noted in the Assessment and Plan.  R post shoulder x 1 Erythematous stuck-on, waxy papule or plaque    Assessment & Plan   SKIN CANCER SCREENING PERFORMED TODAY.  ACTINIC DAMAGE - Chronic condition, secondary to cumulative UV/sun exposure - diffuse scaly erythematous macules with underlying dyspigmentation - Recommend daily broad spectrum sunscreen SPF 30+ to sun-exposed areas, reapply every 2 hours as needed.  - Staying in the shade or wearing long sleeves, sun glasses (UVA+UVB protection) and wide brim hats (4-inch brim around the entire circumference of the hat) are also recommended for sun protection.  - Call for new or changing lesions.  LENTIGINES, SEBORRHEIC KERATOSES, HEMANGIOMAS - Benign normal skin lesions - Benign-appearing - Call for any changes  MELANOCYTIC NEVI - Tan-brown and/or  pink-flesh-colored symmetric macules and papules - Benign appearing on exam today - Observation - Call clinic for new or changing moles - Recommend daily use of broad spectrum spf 30+ sunscreen to sun-exposed areas.   History of Dysplastic Nevi - No evidence of recurrence today - Recommend regular full body skin exams - Recommend daily broad spectrum sunscreen SPF 30+ to sun-exposed areas, reapply every 2 hours as needed.  - Call if any new or changing lesions are noted between office visits  ATOPIC DERMATITIS Exam: Scaly pink papules bilateral antecubital fossae  Chronic and persistent condition with duration or expected duration over one year. Condition is symptomatic/ bothersome to patient. Not currently at goal.   Atopic dermatitis (eczema) is a chronic, relapsing, pruritic condition that can significantly affect quality of life. It is often associated with allergic rhinitis and/or asthma and can require treatment with topical medications, phototherapy, or in severe cases biologic injectable medication (Dupixent; Adbry) or Oral JAK inhibitors.  Treatment Plan: Start Elidel twice daily to affected areas as needed for rash. May require PA. Don't pick up if expensive  Recommend gentle skin care.    Inflamed seborrheic keratosis R post shoulder x 1  Destruction of lesion - R post shoulder x 1 Complexity: simple   Destruction method: cryotherapy   Informed consent: discussed and consent obtained   Timeout:  patient name, date of birth, surgical site, and procedure verified Lesion destroyed using liquid nitrogen: Yes   Region frozen until ice ball extended beyond lesion: Yes   Cryo cycles: 1 or 2. Outcome: patient tolerated procedure well with no complications   Post-procedure details: wound care instructions given    Nummular dermatitis  Related  Medications hydrocortisone 2.5 % cream Apply topically 2 (two) times daily as needed (Rash). Apply to itchy rash under arms  qd/bid until clear, then prn flares  Crisaborole (EUCRISA) 2 % OINT Apply to affected areas rash once to twice daily until improved.  pimecrolimus (ELIDEL) 1 % cream Apply topically 2 (two) times daily.   Return in about 1 year (around 03/04/2024) for TBSE, with Dr. Katrinka Blazing, Hx Dysplastic Nevi.  Anise Salvo, RMA, am acting as scribe for Elie Goody, MD .   Documentation: I have reviewed the above documentation for accuracy and completeness, and I agree with the above.  Elie Goody, MD

## 2023-03-06 DIAGNOSIS — M542 Cervicalgia: Secondary | ICD-10-CM | POA: Diagnosis not present

## 2023-03-07 DIAGNOSIS — M9903 Segmental and somatic dysfunction of lumbar region: Secondary | ICD-10-CM | POA: Diagnosis not present

## 2023-03-07 DIAGNOSIS — M5441 Lumbago with sciatica, right side: Secondary | ICD-10-CM | POA: Diagnosis not present

## 2023-03-07 DIAGNOSIS — M9904 Segmental and somatic dysfunction of sacral region: Secondary | ICD-10-CM | POA: Diagnosis not present

## 2023-03-07 DIAGNOSIS — M461 Sacroiliitis, not elsewhere classified: Secondary | ICD-10-CM | POA: Diagnosis not present

## 2023-03-11 DIAGNOSIS — K219 Gastro-esophageal reflux disease without esophagitis: Secondary | ICD-10-CM | POA: Diagnosis not present

## 2023-03-13 DIAGNOSIS — M542 Cervicalgia: Secondary | ICD-10-CM | POA: Diagnosis not present

## 2023-03-20 DIAGNOSIS — M542 Cervicalgia: Secondary | ICD-10-CM | POA: Diagnosis not present

## 2023-03-25 ENCOUNTER — Other Ambulatory Visit: Payer: Self-pay

## 2023-03-25 DIAGNOSIS — F909 Attention-deficit hyperactivity disorder, unspecified type: Secondary | ICD-10-CM

## 2023-03-25 MED ORDER — AMPHETAMINE-DEXTROAMPHETAMINE 20 MG PO TABS: 20.0000 mg | ORAL_TABLET | Freq: Two times a day (BID) | ORAL | 0 refills | Status: DC

## 2023-03-27 DIAGNOSIS — M542 Cervicalgia: Secondary | ICD-10-CM | POA: Diagnosis not present

## 2023-04-08 DIAGNOSIS — M9903 Segmental and somatic dysfunction of lumbar region: Secondary | ICD-10-CM | POA: Diagnosis not present

## 2023-04-08 DIAGNOSIS — M9904 Segmental and somatic dysfunction of sacral region: Secondary | ICD-10-CM | POA: Diagnosis not present

## 2023-04-08 DIAGNOSIS — M461 Sacroiliitis, not elsewhere classified: Secondary | ICD-10-CM | POA: Diagnosis not present

## 2023-04-08 DIAGNOSIS — M5441 Lumbago with sciatica, right side: Secondary | ICD-10-CM | POA: Diagnosis not present

## 2023-04-29 ENCOUNTER — Other Ambulatory Visit: Payer: Self-pay

## 2023-04-29 DIAGNOSIS — F909 Attention-deficit hyperactivity disorder, unspecified type: Secondary | ICD-10-CM

## 2023-04-29 MED ORDER — AMPHETAMINE-DEXTROAMPHETAMINE 20 MG PO TABS: 20.0000 mg | ORAL_TABLET | Freq: Two times a day (BID) | ORAL | 0 refills | Status: DC

## 2023-05-07 DIAGNOSIS — M5441 Lumbago with sciatica, right side: Secondary | ICD-10-CM | POA: Diagnosis not present

## 2023-05-07 DIAGNOSIS — M9904 Segmental and somatic dysfunction of sacral region: Secondary | ICD-10-CM | POA: Diagnosis not present

## 2023-05-07 DIAGNOSIS — M461 Sacroiliitis, not elsewhere classified: Secondary | ICD-10-CM | POA: Diagnosis not present

## 2023-05-07 DIAGNOSIS — M9903 Segmental and somatic dysfunction of lumbar region: Secondary | ICD-10-CM | POA: Diagnosis not present

## 2023-05-28 ENCOUNTER — Other Ambulatory Visit: Payer: Self-pay

## 2023-05-28 DIAGNOSIS — F909 Attention-deficit hyperactivity disorder, unspecified type: Secondary | ICD-10-CM

## 2023-05-28 MED ORDER — AMPHETAMINE-DEXTROAMPHETAMINE 20 MG PO TABS: 20.0000 mg | ORAL_TABLET | Freq: Two times a day (BID) | ORAL | 0 refills | Status: DC

## 2023-06-04 DIAGNOSIS — M9904 Segmental and somatic dysfunction of sacral region: Secondary | ICD-10-CM | POA: Diagnosis not present

## 2023-06-04 DIAGNOSIS — M5441 Lumbago with sciatica, right side: Secondary | ICD-10-CM | POA: Diagnosis not present

## 2023-06-04 DIAGNOSIS — M9903 Segmental and somatic dysfunction of lumbar region: Secondary | ICD-10-CM | POA: Diagnosis not present

## 2023-06-04 DIAGNOSIS — M461 Sacroiliitis, not elsewhere classified: Secondary | ICD-10-CM | POA: Diagnosis not present

## 2023-06-28 ENCOUNTER — Ambulatory Visit: Payer: Self-pay

## 2023-06-28 ENCOUNTER — Other Ambulatory Visit: Payer: Self-pay

## 2023-06-28 DIAGNOSIS — R7303 Prediabetes: Secondary | ICD-10-CM

## 2023-06-28 DIAGNOSIS — Z0000 Encounter for general adult medical examination without abnormal findings: Secondary | ICD-10-CM

## 2023-06-28 DIAGNOSIS — F909 Attention-deficit hyperactivity disorder, unspecified type: Secondary | ICD-10-CM

## 2023-06-28 LAB — POCT URINALYSIS DIPSTICK
Bilirubin, UA: NEGATIVE
Glucose, UA: NEGATIVE
Ketones, UA: NEGATIVE
Nitrite, UA: NEGATIVE
Protein, UA: POSITIVE — AB
Spec Grav, UA: >=1.03 — AB (ref 1.010–1.025)
Urobilinogen, UA: 0.2 U/dL
pH, UA: 5.5 (ref 5.0–8.0)

## 2023-06-28 MED ORDER — AMPHETAMINE-DEXTROAMPHETAMINE 20 MG PO TABS: 20.0000 mg | ORAL_TABLET | Freq: Two times a day (BID) | ORAL | 0 refills | Status: DC

## 2023-06-28 NOTE — Progress Notes (Signed)
 Pt completed labs for physical. Renee Ayala

## 2023-06-29 LAB — CMP12+LP+TP+TSH+6AC+CBC/D/PLT
ALT: 26 IU/L (ref 0–32)
AST: 27 IU/L (ref 0–40)
Albumin: 4.1 g/dL (ref 3.8–4.9)
Alkaline Phosphatase: 123 IU/L — ABNORMAL HIGH (ref 44–121)
BUN/Creatinine Ratio: 18 (ref 9–23)
BUN: 12 mg/dL (ref 6–24)
Basophils Absolute: 0 10*3/uL (ref 0.0–0.2)
Basos: 1 %
Bilirubin Total: 0.3 mg/dL (ref 0.0–1.2)
Calcium: 8.9 mg/dL (ref 8.7–10.2)
Chloride: 106 mmol/L (ref 96–106)
Chol/HDL Ratio: 3 ratio (ref 0.0–4.4)
Cholesterol, Total: 171 mg/dL (ref 100–199)
Creatinine, Ser: 0.67 mg/dL (ref 0.57–1.00)
EOS (ABSOLUTE): 0.2 10*3/uL (ref 0.0–0.4)
Eos: 5 %
Estimated CHD Risk: <0.5 times avg. (ref 0.0–1.0)
Free Thyroxine Index: 1.9 (ref 1.2–4.9)
GGT: 28 IU/L (ref 0–60)
Globulin, Total: 2.2 g/dL (ref 1.5–4.5)
Glucose: 116 mg/dL — ABNORMAL HIGH (ref 70–99)
HDL: 57 mg/dL (ref >39)
Hematocrit: 41.4 % (ref 34.0–46.6)
Hemoglobin: 13.4 g/dL (ref 11.1–15.9)
Immature Grans (Abs): 0 10*3/uL (ref 0.0–0.1)
Immature Granulocytes: 0 %
Iron: 42 ug/dL (ref 27–159)
LDH: 231 IU/L — ABNORMAL HIGH (ref 119–226)
LDL Chol Calc (NIH): 95 mg/dL (ref 0–99)
Lymphocytes Absolute: 2.4 10*3/uL (ref 0.7–3.1)
Lymphs: 47 %
MCH: 27.1 pg (ref 26.6–33.0)
MCHC: 32.4 g/dL (ref 31.5–35.7)
MCV: 84 fL (ref 79–97)
Monocytes Absolute: 0.4 10*3/uL (ref 0.1–0.9)
Monocytes: 7 %
Neutrophils Absolute: 2 10*3/uL (ref 1.4–7.0)
Neutrophils: 40 %
Phosphorus: 4.3 mg/dL (ref 3.0–4.3)
Platelets: 308 10*3/uL (ref 150–450)
Potassium: 4.4 mmol/L (ref 3.5–5.2)
RBC: 4.94 x10E6/uL (ref 3.77–5.28)
RDW: 14.7 % (ref 11.7–15.4)
Sodium: 142 mmol/L (ref 134–144)
T3 Uptake Ratio: 29 % (ref 24–39)
T4, Total: 6.4 ug/dL (ref 4.5–12.0)
TSH: 1.97 u[IU]/mL (ref 0.450–4.500)
Total Protein: 6.3 g/dL (ref 6.0–8.5)
Triglycerides: 105 mg/dL (ref 0–149)
Uric Acid: 3.6 mg/dL (ref 3.0–7.2)
VLDL Cholesterol Cal: 19 mg/dL (ref 5–40)
WBC: 5 10*3/uL (ref 3.4–10.8)
eGFR: 104 mL/min/{1.73_m2} (ref >59)

## 2023-06-29 LAB — HGB A1C W/O EAG: Hgb A1c MFr Bld: 5.9 % — ABNORMAL HIGH (ref 4.8–5.6)

## 2023-07-04 ENCOUNTER — Encounter: Payer: 59 | Admitting: Physician Assistant

## 2023-07-08 ENCOUNTER — Encounter: Payer: Self-pay | Admitting: Physician Assistant

## 2023-07-08 ENCOUNTER — Ambulatory Visit: Payer: Self-pay | Admitting: Physician Assistant

## 2023-07-08 VITALS — BP 134/82 | HR 72 | Temp 98.4°F | Resp 16 | Wt 205.0 lb

## 2023-07-08 DIAGNOSIS — Z0000 Encounter for general adult medical examination without abnormal findings: Secondary | ICD-10-CM

## 2023-07-08 MED ORDER — PHENTERMINE HCL 30 MG PO CAPS: 30.0000 mg | ORAL_CAPSULE | ORAL | 0 refills | Status: DC

## 2023-07-08 NOTE — Progress Notes (Signed)
 Here for yearly physical with provider.  Only complaint is age related post menopausal weight gain and craving for sugar which is new from the pre menopause.

## 2023-07-08 NOTE — Progress Notes (Signed)
 [facility]  ____________________________________________   None    (approximate)  I have reviewed the triage vital signs and the nursing notes.   HISTORY  Chief Complaint Annual Exam   HPI Renee Ayala is a 53 y.o. female patient presents through annual physical exam.  Patient requests evaluation for weight maintenance.         Past Medical History:  Diagnosis Date   ADHD    Elevated blood pressure reading    Herpes simplex type 2 infection    History of dysplastic nevus 05/17/2004   left buttock   Over weight     Patient Active Problem List   Diagnosis Date Noted   Neck pain 02/26/2023   Cervical intraepithelial neoplasia grade III with severe dysplasia 12/18/2022   Cyst of ovary 12/18/2022   Genital herpes simplex 12/18/2022   Menorrhagia 12/18/2022   Attention deficit hyperactivity disorder (ADHD) 08/14/2021   Essential hypertension 08/14/2021   Herpes simplex 08/14/2021   History of endometrial ablation 08/14/2021   UTI (urinary tract infection) 01/08/2019    Past Surgical History:  Procedure Laterality Date   ABLATION     CESAREAN SECTION     X3   LIPO SUCTION     TAMMY TUCK      Prior to Admission medications   Medication Sig Start Date End Date Taking? Authorizing Provider  amphetamine-dextroamphetamine (ADDERALL) 20 MG tablet Take 1 tablet (20 mg total) by mouth 2 (two) times daily. 06/28/23  Yes Joni Reining, PA-C  oxybutynin (DITROPAN-XL) 5 MG 24 hr tablet Take 1 tablet (5 mg total) by mouth at bedtime. 11/07/22  Yes Joni Reining, PA-C  Crisaborole (EUCRISA) 2 % OINT Apply to affected areas rash once to twice daily until improved. Patient not taking: Reported on 07/08/2023 02/13/22   Willeen Niece, MD  Halcinonide 0.1 % CREA APPLY A SMALL AMOUNT TO AFFECTED AREA TWICE A DAY AS NEEDED Patient not taking: Reported on 07/08/2023 02/03/19   [provider]  hydrocortisone 2.5 % cream Apply  topically 2 (two) times daily as needed (Rash). Apply to itchy rash under arms qd/bid until clear, then prn flares Patient not taking: Reported on 07/08/2023 05/23/20   Willeen Niece, MD  hydrocortisone valerate cream (WESTCORT) 0.2 % Apply 1 Application topically 2 (two) times daily. Patient not taking: Reported on 07/08/2023 08/14/22   Joni Reining, PA-C  meloxicam [facility]) 15 MG tablet meloxicam 15 mg tablet Patient not taking: Reported on 07/08/2023    [provider]  pimecrolimus (ELIDEL) 1 % cream Apply topically 2 (two) times daily. Patient not taking: Reported on 07/08/2023 03/05/23   Elie Goody, MD  valACYclovir (VALTREX) 500 MG tablet Take 1 tablet (500 mg total) by mouth daily. Patient not taking: Reported on 07/08/2023 04/12/22   Joni Reining, PA-C    Allergies Patient has no known allergies.  Family History  Problem Relation Age of Onset   Breast cancer Neg Hx     Social History Social History   Tobacco Use   Smoking status: Never   Smokeless tobacco: Never  Substance Use Topics   Alcohol use: Yes    Comment: socially   Drug use: No    Review of Systems Constitutional: No fever/chills.  Overweight Eyes: No visual changes. ENT: No sore throat. Cardiovascular: Denies chest pain. Respiratory: Denies shortness of breath. Gastrointestinal: No abdominal pain.  No nausea, no vomiting.  No diarrhea.  No constipation. Genitourinary: Negative for dysuria.  Musculoskeletal: Negative for back pain. Skin: Negative for rash. Neurological: Negative for headaches, focal weakness or numbness. Psychiatric: ADHD  ____________________________________________   PHYSICAL EXAM: VITAL SIGNS:  07/08/2023   8:16 AM 8:32 AM    BP 144/74BP. 144/74. Data is abnormal. Taken on 07/08/23 8:16 AM 134/82  Pulse Rate 72 --  Temp 98.4 F (36.9 C) --  Weight 205 lb (93 kg) --  Resp 16 --  SpO2 98 % --   BMI: 37.49 kg/m2  BSA: 2.02 m2   Constitutional: Alert and  oriented. Well appearing and in no acute distress. Eyes: Conjunctivae are normal. PERRL. EOMI. Head: Atraumatic. Nose: No congestion/rhinnorhea. Mouth/Throat: Mucous membranes are moist.  Oropharynx non-erythematous. Neck: No stridor.  No cervical spine tenderness to palpation. Hematological/Lymphatic/Immunilogical: No cervical lymphadenopathy. Cardiovascular: Normal rate, regular rhythm. Grossly normal heart sounds.  Good peripheral circulation. Respiratory: Normal respiratory effort.  No retractions. Lungs CTAB. Gastrointestinal: Soft and nontender. No distention. No abdominal bruits. No CVA tenderness. Genitourinary: Deferred Musculoskeletal: No lower extremity tenderness nor edema.  No joint effusions. Neurologic:  Normal speech and language. No gross focal neurologic deficits are appreciated. No gait instability. Skin:  Skin is warm, dry and intact. No rash noted. Psychiatric: Mood and affect are normal. Speech and behavior are normal.  ____________________________________________   LABS           Component Ref Range & Units (hover) 10 d ago (06/28/23) 1 yr ago (06/27/22) 1 yr ago (07/31/21) 2 yr ago (03/22/21) 2 yr ago (07/20/20) 3 yr ago (08/13/19) 4 yr ago (01/08/19)  Color, UA yellow Yellow yellow Amber Light Yellow Yellow Amber  Clarity, UA clear Clear clear clear Clear Clear Cloudy  Glucose, UA Negative Negative Negative Negative Negative Negative Negative  Bilirubin, UA neg Negative negative Negative Negative Negative Negative  Ketones, UA neg Negative negative Negative Negative Negative Negative  Spec Grav, UA >=1.030 Abnormal  >=1.030 Abnormal  1.015 1.025 1.025 >=1.030 Abnormal  >=1.030 Abnormal   Blood, UA trace -+ Negative negative Positive CM Negative Postive CM 3+ CM  pH, UA 5.5 5.5 6.0 6.0 6.0 5.0 5.5  Protein, UA Positive Abnormal  Negative Negative Negative Negative Negative Positive Abnormal  CM  Comment: trace -+  Urobilinogen, UA 0.2 0.2 0.2 0.2 0.2 0.2 0.2   Nitrite, UA neg Negative negative Negative Negative Negative Positive  Leukocytes, UA Trace Abnormal  Negative Negative Negative Negative Negative Small (1+) Abnormal  CM  Comment: -+  Appearance    Clear     Odor    None                View All Conversations on this Encounter     Result Notes         Component Ref Range & Units (hover) 10 d ago (06/28/23) 1 yr ago (06/27/22) 1 yr ago (07/31/21) 2 yr ago (07/20/20) 3 yr ago (08/13/19) 5 yr ago (07/30/17)  Glucose 116 High  106 High  112 High  91 R 105 High  R   Uric Acid 3.6 3.1 CM 3.2 CM 1.8 Low  R, CM 3.5 R, CM   Comment:            Therapeutic target for gout patients: <6.0  BUN 12 10 9 9 12    Creatinine, Ser 0.67 0.68 0.70 0.67 0.67   eGFR 104 105 105 106    BUN/Creatinine Ratio 18 15 13 13 18    Sodium 142 140 140 138 137   Potassium 4.4  4.3 4.2 4.1 4.1   Chloride 106 105 107 High  103 108 High    Calcium 8.9 8.9 8.4 Low  8.5 Low  8.2 Low    Phosphorus 4.3 4.2 3.7 3.7 3.3   Total Protein 6.3 6.5 6.4 6.1 6.0   Albumin 4.1 4.2 4.2 3.5 Low  R 3.9 R   Globulin, Total 2.2 2.3 2.2 2.6 2.1   Bilirubin Total 0.3 0.3 0.2 0.3 0.5   Alkaline Phosphatase 123 High  100 101 69 72 R   LDH 231 High  206 185 175 176   AST 27 29 22 24 20    ALT 26 24 15 22 17    GGT 28 22 13 12 20    Iron 42 29 26 Low  57 77   Cholesterol, Total 171 198 197 151 152   Triglycerides 105 64 79 76 67 55 R  HDL 57 69 61 53 49 56 R  VLDL Cholesterol Cal 19 12 14 15 13    LDL Chol Calc (NIH) 95 117 High  122 High  83 90   Chol/HDL Ratio 3.0 2.9 CM 3.2 CM 2.8 CM 3.1 CM   Comment:                                   T. Chol/HDL Ratio                                             Men  Women                               1/2 Avg.Risk  3.4    3.3                                   Avg.Risk  5.0    4.4                                2X Avg.Risk  9.6    7.1                                3X Avg.Risk 23.4   11.0  Estimated CHD Risk  < 0.5  < 0.5 CM  < 0.5 CM  < 0.5 CM   < 0.5 CM   Comment: The CHD Risk is based on the T. Chol/HDL ratio. Other factors affect CHD Risk such as hypertension, smoking, diabetes, severe obesity, and family history of premature CHD.  TSH 1.970 1.860 2.340 2.300 1.750   T4, Total 6.4 5.8 5.7 6.8 5.8   T3 Uptake Ratio 29 27 28  32 28   Free Thyroxine Index 1.9 1.6 1.6 2.2 1.6   WBC 5.0 4.7 4.7 7.0 6.8   RBC 4.94 4.68 4.94 4.88 4.86   Hemoglobin 13.4 12.1 13.1 14.2 14.4   Hematocrit 41.4 37.9 40.1 42.1 42.6   MCV 84 81 81 86 88   MCH 27.1 25.9 Low  26.5 Low  29.1 29.6   MCHC 32.4 31.9 32.7 33.7 33.8   RDW 14.7 14.6 13.1 12.5 13.1  Platelets 308 307 270 215 190   Neutrophils 40 43 34 49 50   Lymphs 47 45 54 40 39   Monocytes 7 7 7 7 8    Eos 5 4 4 3 3    Basos 1 1 1 1  0   Neutrophils Absolute 2.0 2.0 1.6 3.5 3.4   Lymphocytes Absolute 2.4 2.1 2.6 2.8 2.6   Monocytes Absolute 0.4 0.3 0.3 0.5 0.5   EOS (ABSOLUTE) 0.2 0.2 0.2 0.2 0.2   Basophils Absolute 0.0 0.0 0.1 0.0 0.0   Immature Granulocytes 0 0 0 0 0   Immature Grans (Abs) 0.0 0.0 0.0 0.0 0.0   Resulting Agency [facility]          View All Conversations on this Encounter            Component Ref Range & Units (hover) 10 d ago (06/28/23) 1 yr ago (06/27/22) 1 yr ago (07/31/21)  Hgb A1c MFr Bld 5.9 High  5.9 High  CM 6.0 High  CM  Comment:          Prediabetes: 5.7 - 6.4          Diabetes: >6.4          Glycemic control for adults with diabetes: <7.0          ____________________________________________  EKG  Sinus rhythm at 72 bpm ____________________________________________   ____________________________________________   INITIAL IMPRESSION / ASSESSMENT AND PLAN  As part of my medical decision making, I reviewed the following data within the electronic MEDICAL RECORD NUMBER       No acute findings on physical exam except for being overweight.  No acute findings on EKG or lab  results.     ____________________________________________   FINAL CLINICAL IMPRESSIONS     ED Discharge Orders     None        Note:  This document was prepared using Dragon voice recognition software and may include unintentional dictation errors.

## 2023-07-09 DIAGNOSIS — M9903 Segmental and somatic dysfunction of lumbar region: Secondary | ICD-10-CM | POA: Diagnosis not present

## 2023-07-09 DIAGNOSIS — M5441 Lumbago with sciatica, right side: Secondary | ICD-10-CM | POA: Diagnosis not present

## 2023-07-09 DIAGNOSIS — M9904 Segmental and somatic dysfunction of sacral region: Secondary | ICD-10-CM | POA: Diagnosis not present

## 2023-07-09 DIAGNOSIS — M461 Sacroiliitis, not elsewhere classified: Secondary | ICD-10-CM | POA: Diagnosis not present

## 2023-07-22 ENCOUNTER — Other Ambulatory Visit: Payer: Self-pay | Admitting: Physician Assistant

## 2023-07-22 DIAGNOSIS — B009 Herpesviral infection, unspecified: Secondary | ICD-10-CM

## 2023-08-05 ENCOUNTER — Other Ambulatory Visit: Payer: Self-pay

## 2023-08-05 ENCOUNTER — Ambulatory Visit: Payer: Self-pay

## 2023-08-05 DIAGNOSIS — F909 Attention-deficit hyperactivity disorder, unspecified type: Secondary | ICD-10-CM

## 2023-08-05 DIAGNOSIS — Z79899 Other long term (current) drug therapy: Secondary | ICD-10-CM

## 2023-08-05 MED ORDER — AMPHETAMINE-DEXTROAMPHETAMINE 20 MG PO TABS: 20.0000 mg | ORAL_TABLET | Freq: Two times a day (BID) | ORAL | 0 refills | Status: DC

## 2023-08-05 MED ORDER — PHENTERMINE HCL 30 MG PO CAPS: 30.0000 mg | ORAL_CAPSULE | ORAL | 0 refills | Status: DC

## 2023-08-05 NOTE — Progress Notes (Signed)
 Tolerating Phentermine  well and taking as prescribed and noting weight loss and no complaints of constipation and taking water well.  Wishes to continue this and refill requested.  No complaint of insomnia.

## 2023-08-06 DIAGNOSIS — M5441 Lumbago with sciatica, right side: Secondary | ICD-10-CM | POA: Diagnosis not present

## 2023-08-06 DIAGNOSIS — M9903 Segmental and somatic dysfunction of lumbar region: Secondary | ICD-10-CM | POA: Diagnosis not present

## 2023-08-06 DIAGNOSIS — M461 Sacroiliitis, not elsewhere classified: Secondary | ICD-10-CM | POA: Diagnosis not present

## 2023-08-06 DIAGNOSIS — M9904 Segmental and somatic dysfunction of sacral region: Secondary | ICD-10-CM | POA: Diagnosis not present

## 2023-09-03 DIAGNOSIS — M9903 Segmental and somatic dysfunction of lumbar region: Secondary | ICD-10-CM | POA: Diagnosis not present

## 2023-09-03 DIAGNOSIS — M5441 Lumbago with sciatica, right side: Secondary | ICD-10-CM | POA: Diagnosis not present

## 2023-09-03 DIAGNOSIS — M9904 Segmental and somatic dysfunction of sacral region: Secondary | ICD-10-CM | POA: Diagnosis not present

## 2023-09-03 DIAGNOSIS — M461 Sacroiliitis, not elsewhere classified: Secondary | ICD-10-CM | POA: Diagnosis not present

## 2023-09-10 ENCOUNTER — Other Ambulatory Visit: Payer: Self-pay

## 2023-09-10 DIAGNOSIS — F909 Attention-deficit hyperactivity disorder, unspecified type: Secondary | ICD-10-CM

## 2023-09-10 DIAGNOSIS — K219 Gastro-esophageal reflux disease without esophagitis: Secondary | ICD-10-CM | POA: Diagnosis not present

## 2023-09-10 MED ORDER — AMPHETAMINE-DEXTROAMPHETAMINE 20 MG PO TABS: 20.0000 mg | ORAL_TABLET | Freq: Two times a day (BID) | ORAL | 0 refills | Status: DC

## 2023-10-01 DIAGNOSIS — M461 Sacroiliitis, not elsewhere classified: Secondary | ICD-10-CM | POA: Diagnosis not present

## 2023-10-01 DIAGNOSIS — M9904 Segmental and somatic dysfunction of sacral region: Secondary | ICD-10-CM | POA: Diagnosis not present

## 2023-10-04 ENCOUNTER — Other Ambulatory Visit: Payer: Self-pay

## 2023-10-04 DIAGNOSIS — F909 Attention-deficit hyperactivity disorder, unspecified type: Secondary | ICD-10-CM

## 2023-10-04 MED ORDER — AMPHETAMINE-DEXTROAMPHETAMINE 20 MG PO TABS: 20.0000 mg | ORAL_TABLET | Freq: Two times a day (BID) | ORAL | 0 refills | Status: DC

## 2023-10-24 ENCOUNTER — Ambulatory Visit: Payer: Self-pay | Admitting: Physician Assistant

## 2023-10-24 ENCOUNTER — Encounter: Payer: Self-pay | Admitting: Physician Assistant

## 2023-10-24 ENCOUNTER — Other Ambulatory Visit: Payer: Self-pay | Admitting: Physician Assistant

## 2023-10-24 VITALS — BP 124/72 | HR 76 | Temp 96.9°F | Resp 16

## 2023-10-24 DIAGNOSIS — W57XXXA Bitten or stung by nonvenomous insect and other nonvenomous arthropods, initial encounter: Secondary | ICD-10-CM

## 2023-10-24 DIAGNOSIS — Z1231 Encounter for screening mammogram for malignant neoplasm of breast: Secondary | ICD-10-CM

## 2023-10-24 MED ORDER — SULFAMETHOXAZOLE-TRIMETHOPRIM 800-160 MG PO TABS: 1.0000 | ORAL_TABLET | Freq: Two times a day (BID) | ORAL | 0 refills | Status: DC

## 2023-10-24 MED ORDER — HYDROXYZINE PAMOATE 25 MG PO CAPS: 25.0000 mg | ORAL_CAPSULE | Freq: Three times a day (TID) | ORAL | 0 refills | Status: DC | PRN

## 2023-10-24 NOTE — Progress Notes (Signed)
   Subjective: Insect bite    Patient ID: Renee Ayala, female    DOB: 07/31/1970, 53 y.o.   MRN: [id number]  HPI Patient presents with edema erythema secondary to vesicle lesion right forearm.  Patient believes she was bitten by insect 2 days ago.  She states in the last 24 hours has been intense itching and redness.  Denies any fever or chills.  No loss sensation.  Full and equal range of motion.   Review of Systems ADHD and hypertension.    Objective:   Physical Exam BP 124/72  Pulse Rate 76  Temp 96.9 F (36.1 C)  Resp 16  SpO2 95 %  Rhythm strip vesicle lesion on erythematous and edematous base of the right forearm.  Signs and symptoms of excoriation.       Assessment & Plan: Infected insect bite  Patient given Benadryl cream in clinic.  Patient given a prescription for Bactrim  DS and Atarax .  Advised to follow-up if no improvement or worsening complaint.

## 2023-10-24 NOTE — Progress Notes (Signed)
 Observed anterior mid forearm red and warm to touch with c/o much itching and appearance of possible bug bite, unknown source stated since yesterday at some point.  No other bites she's aware of and stated itchy and scratching near left wrist without bites noted.  Stated she has hx of skin sensitivities at times.

## 2023-11-01 ENCOUNTER — Other Ambulatory Visit: Payer: Self-pay | Admitting: Medical Genetics

## 2023-11-04 ENCOUNTER — Other Ambulatory Visit
Admission: RE | Admit: 2023-11-04 | Discharge: 2023-11-04 | Disposition: A | Discharge status: Home / Self Care | Payer: Self-pay | Source: Ambulatory Visit | Attending: Medical Genetics | Admitting: Medical Genetics

## 2023-11-05 ENCOUNTER — Ambulatory Visit: Payer: Self-pay | Admitting: Physician Assistant

## 2023-11-05 ENCOUNTER — Encounter: Payer: Self-pay | Admitting: Physician Assistant

## 2023-11-05 VITALS — BP 142/75 | HR 85 | Temp 97.5°F | Resp 16 | Wt 198.0 lb

## 2023-11-05 DIAGNOSIS — Z7689 Persons encountering health services in other specified circumstances: Secondary | ICD-10-CM

## 2023-11-05 NOTE — Progress Notes (Signed)
 Reports tolerating Phentermine  well with some weight loss, but hasn't taken in approximately one month and request refill to continue to lose weight.

## 2023-11-05 NOTE — Progress Notes (Signed)
   Subjective: Weight management    Patient ID: Renee Ayala, female    DOB: [birth year], 53 y.o.   MRN: [id number]  HPI Patient requesting restart phentermine  for weight management.  Patient started on medication 3 months ago but did not follow-up because she did not notice any weight change.  Patient did not follow-up for refills. Review of Systems ADHD, hypertension, and obesity.    Objective:   Physical Exam BP 142/75  Pulse Rate 85  Temp 97.5 F (36.4 C)  Weight 198 lb (89.8 kg)  Resp 16  SpO2 97 %   BMI: 36.21 kg/m2  BSA: 1.98 m2  Physical exam deferred       Assessment & Plan: Weight management   Patient given prescription for phentermine  37.5 mg and will follow-up in 1 month

## 2023-11-06 ENCOUNTER — Other Ambulatory Visit: Payer: Self-pay

## 2023-11-06 ENCOUNTER — Other Ambulatory Visit: Payer: Self-pay | Admitting: Physician Assistant

## 2023-11-06 DIAGNOSIS — Z79899 Other long term (current) drug therapy: Secondary | ICD-10-CM

## 2023-11-06 MED ORDER — PHENTERMINE HCL 37.5 MG PO CAPS: 37.5000 mg | ORAL_CAPSULE | ORAL | 0 refills | Status: DC

## 2023-11-08 ENCOUNTER — Ambulatory Visit
Admission: RE | Admit: 2023-11-08 | Discharge: 2023-11-08 | Disposition: A | Discharge status: Home / Self Care | Source: Ambulatory Visit | Attending: Physician Assistant | Admitting: Physician Assistant

## 2023-11-08 DIAGNOSIS — Z1231 Encounter for screening mammogram for malignant neoplasm of breast: Secondary | ICD-10-CM | POA: Diagnosis not present

## 2023-11-11 ENCOUNTER — Other Ambulatory Visit: Payer: Self-pay

## 2023-11-11 DIAGNOSIS — F909 Attention-deficit hyperactivity disorder, unspecified type: Secondary | ICD-10-CM

## 2023-11-11 MED ORDER — AMPHETAMINE-DEXTROAMPHETAMINE 20 MG PO TABS: 20.0000 mg | ORAL_TABLET | Freq: Two times a day (BID) | ORAL | 0 refills | Status: DC

## 2023-11-15 LAB — GENECONNECT MOLECULAR SCREEN: Genetic Analysis Overall Interpretation: NEGATIVE

## 2023-11-26 ENCOUNTER — Other Ambulatory Visit: Payer: Self-pay | Admitting: Physician Assistant

## 2023-11-26 DIAGNOSIS — N3281 Overactive bladder: Secondary | ICD-10-CM

## 2023-11-30 IMAGING — MG MM DIGITAL SCREENING BILAT W/ TOMO AND CAD
8 series · 8 of 24 positions shown · non-contrast
Comparison: Previous exam(s).

CLINICAL DATA: Screening.

EXAM:
DIGITAL SCREENING BILATERAL MAMMOGRAM WITH TOMOSYNTHESIS AND CAD
TECHNIQUE: Bilateral screening digital craniocaudal and mediolateral oblique
mammograms were obtained. Bilateral screening digital breast
tomosynthesis was performed. The images were evaluated with
computer-aided detection.

[R CC synth-2D]
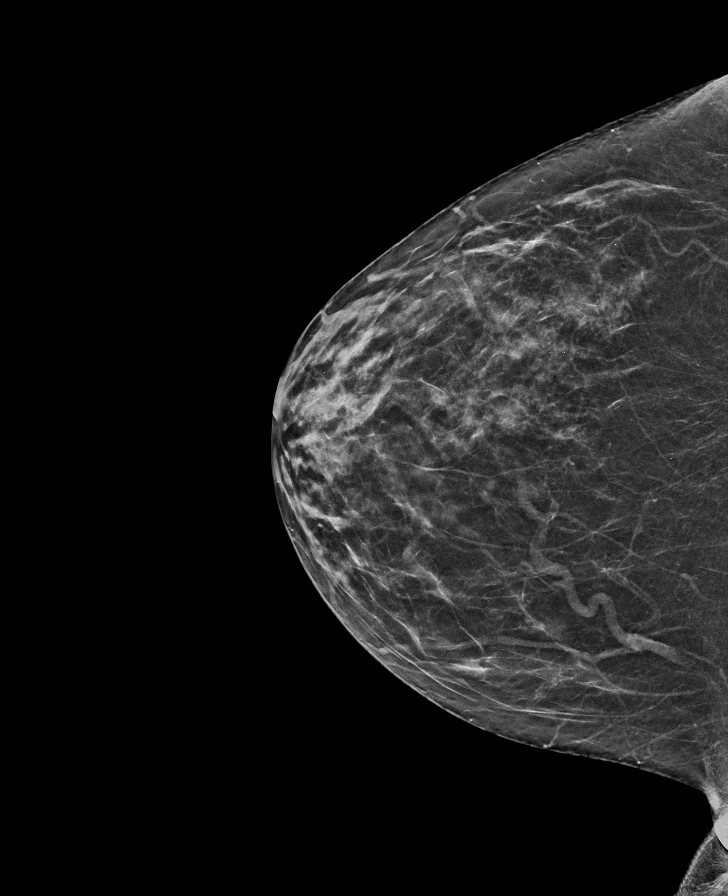

[L CC synth-2D]
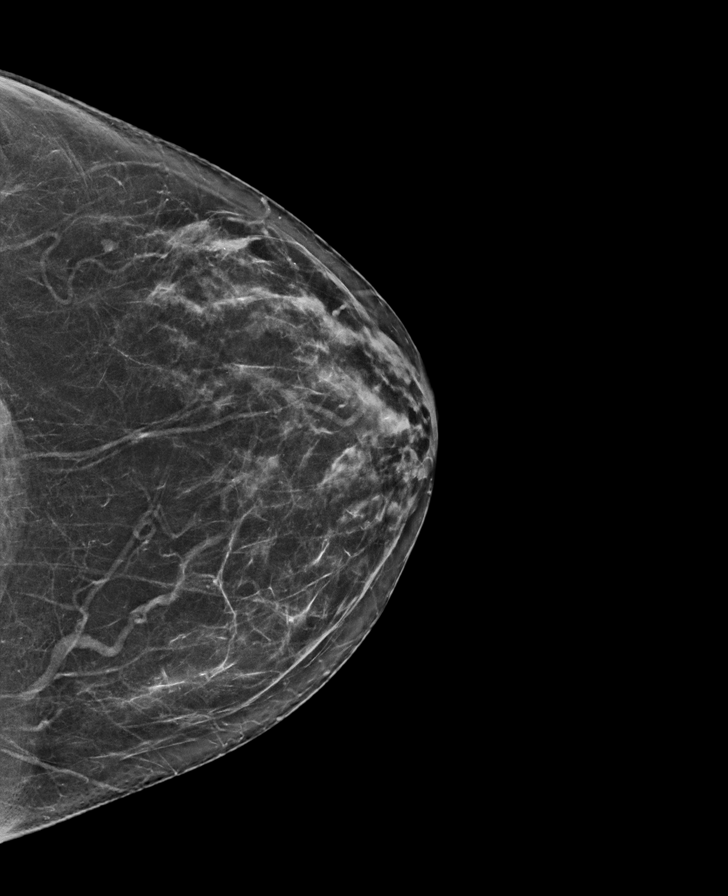

[L MLO synth-2D]
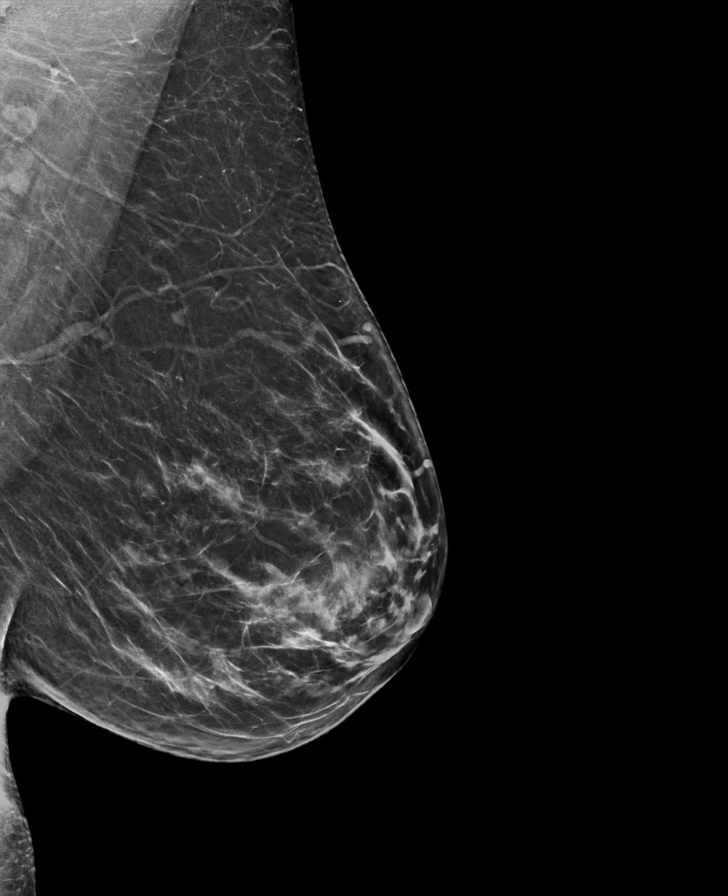

[R MLO synth-2D]
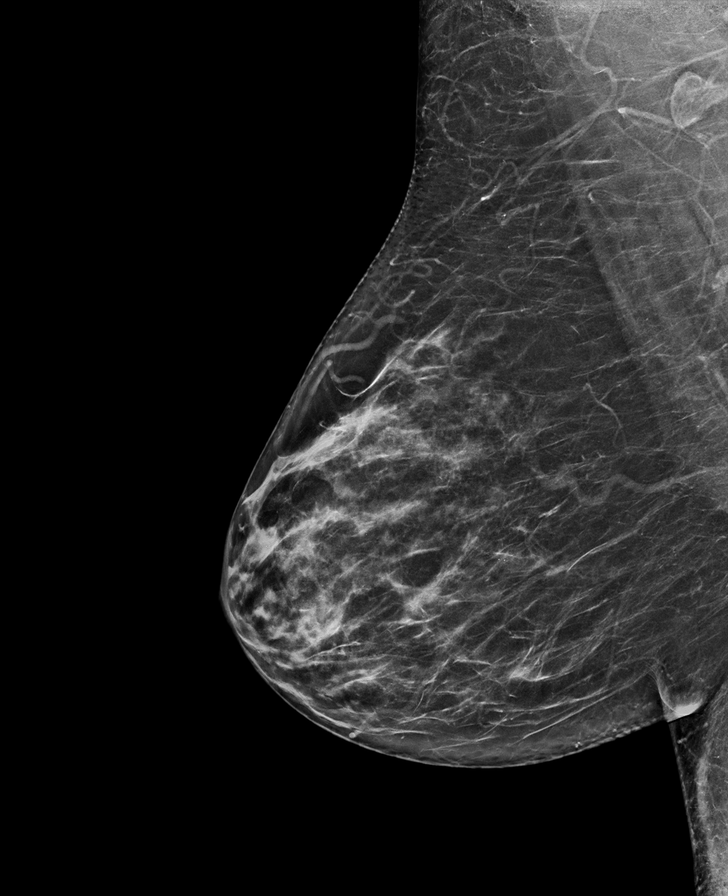

[R CC tomo · tomo slice 31/61.0]
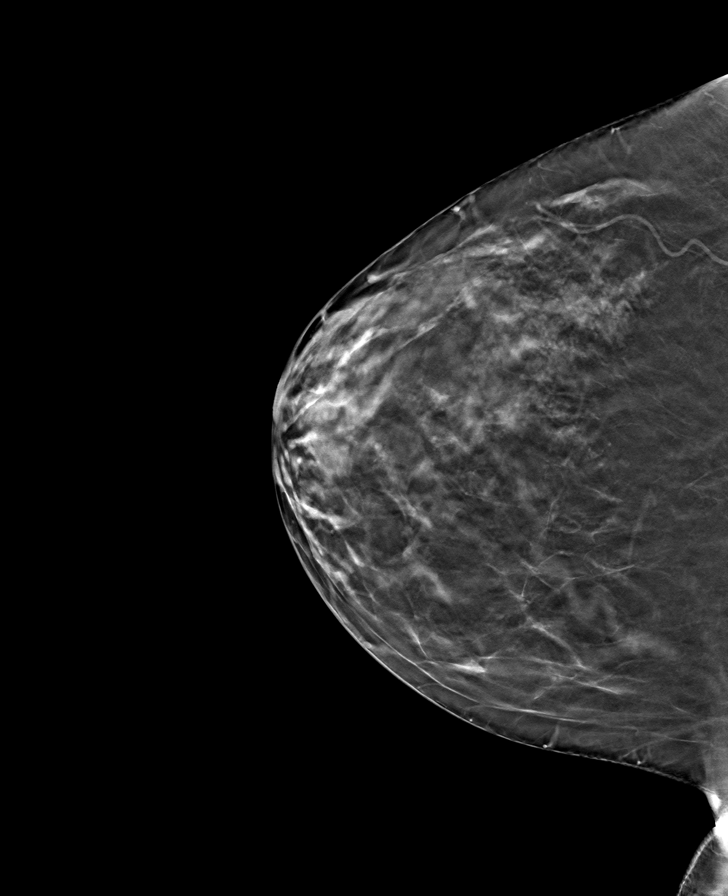

[R MLO tomo · tomo slice 37/73.0]
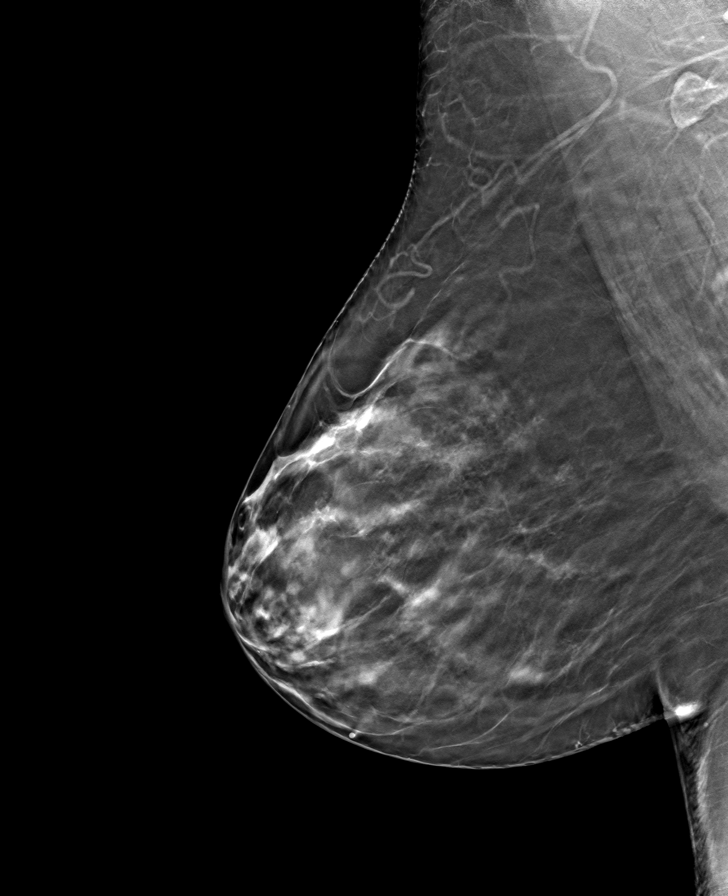

[L CC tomo · tomo slice 36/71.0]
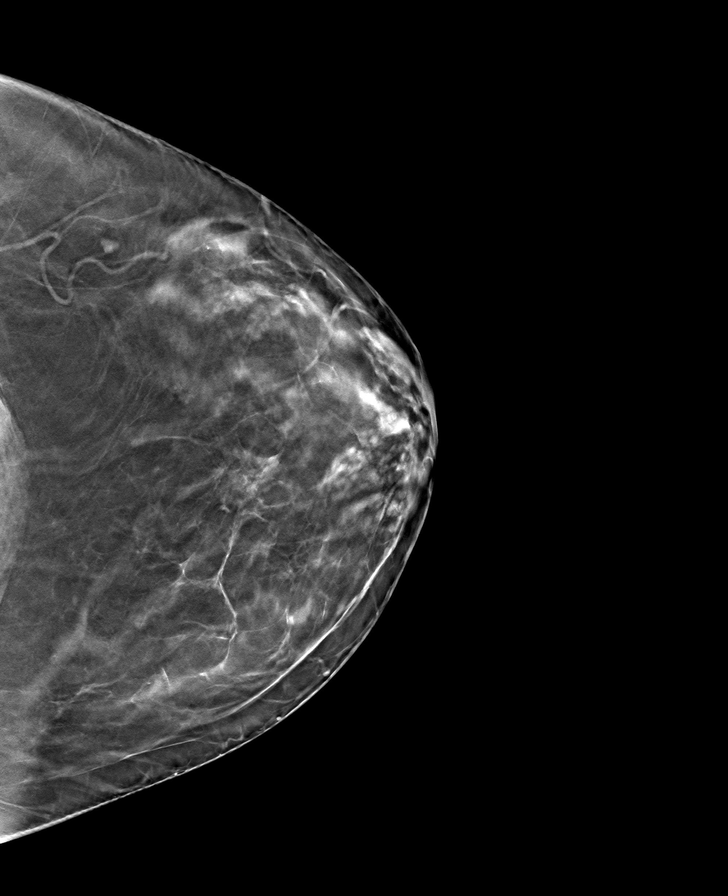

[L MLO tomo · tomo slice 38/75.0]
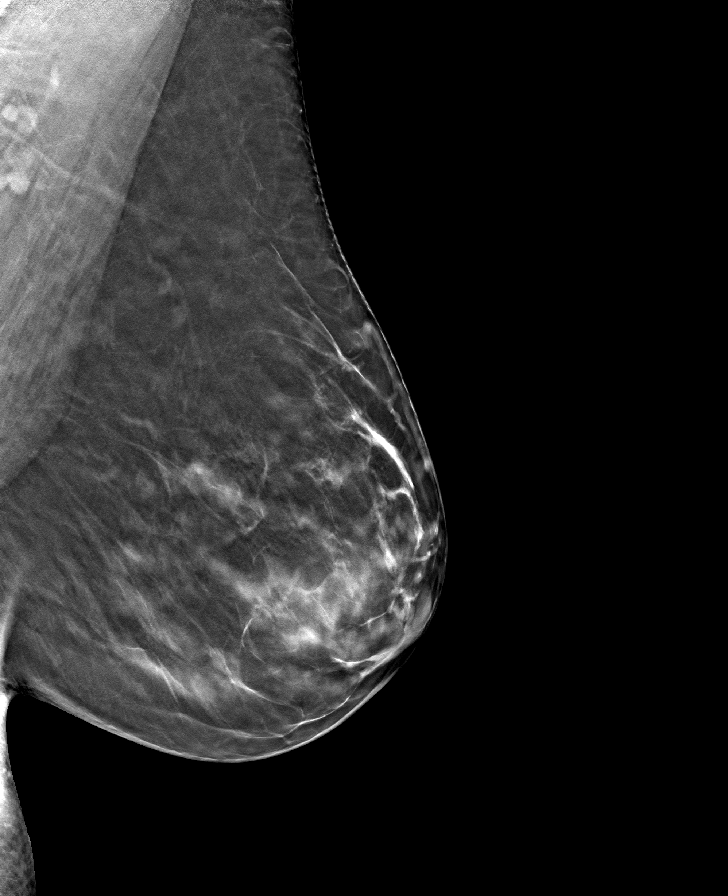

[8 of 24 positions shown; findings below may reference images not displayed]

ACR Breast Density Category b: There are scattered areas of
fibroglandular density.
FINDINGS: There are no findings suspicious for malignancy.
IMPRESSION: No mammographic evidence of malignancy. A result letter of this
screening mammogram will be mailed directly to the patient.

RECOMMENDATION:
Screening mammogram in one year. (Code:51-O-LD2)

BI-RADS CATEGORY  1: Negative.

## 2023-12-04 ENCOUNTER — Encounter: Payer: Self-pay | Admitting: Physician Assistant

## 2023-12-04 ENCOUNTER — Ambulatory Visit: Payer: Self-pay | Admitting: Physician Assistant

## 2023-12-04 ENCOUNTER — Other Ambulatory Visit: Payer: Self-pay

## 2023-12-04 VITALS — BP 144/73 | HR 71 | Temp 97.9°F | Resp 16 | Wt 197.0 lb

## 2023-12-04 DIAGNOSIS — W57XXXA Bitten or stung by nonvenomous insect and other nonvenomous arthropods, initial encounter: Secondary | ICD-10-CM

## 2023-12-04 DIAGNOSIS — Z7689 Persons encountering health services in other specified circumstances: Secondary | ICD-10-CM

## 2023-12-04 MED ORDER — HYDROXYZINE PAMOATE 25 MG PO CAPS: 25.0000 mg | ORAL_CAPSULE | Freq: Three times a day (TID) | ORAL | 0 refills | Status: AC | PRN

## 2023-12-04 MED ORDER — SULFAMETHOXAZOLE-TRIMETHOPRIM 800-160 MG PO TABS: 1.0000 | ORAL_TABLET | Freq: Two times a day (BID) | ORAL | 0 refills | Status: AC

## 2023-12-04 MED ORDER — PHENTERMINE HCL 37.5 MG PO CAPS
37.5000 mg | ORAL_CAPSULE | ORAL | 0 refills | Status: AC
Start: 2023-12-04 — End: ?

## 2023-12-04 NOTE — Progress Notes (Signed)
 Reports an unknown bug bit top of left hand and note some mild redness and inflammation.  Also reports compliant and effective use of Phentermine  and wishes refill.  Afebrile.

## 2023-12-04 NOTE — Progress Notes (Signed)
   Subjective: insect bite left hand    Patient ID: Renee Ayala, female    DOB: 1970-08-08, 53 y.o.   MRN: [id number]  HPI Patient presents with edema and erythema to the volar aspect of the left hand.  Patient states she believes she was bit by an insect approximately 5 days ago.  States increased itching with complaint.  No palliative measure for complaint.   Review of Systems ADHD and hypertension.    Objective:   Physical Exam BP 144/73  Pulse Rate 71  Temp 97.9 F (36.6 C)  Weight 197 lb (89.4 kg)  Resp 16  SpO2 97 %  Walk edema and erythema to the volar aspect of the left hand.  Neurovascular intact with free and equal range of motion.       Assessment & Plan: Infected insect bite   Patient given prescription for Bactrim  DS and Atarax .  Patient advised return back if no improvement in 3 to 5 days.

## 2023-12-11 ENCOUNTER — Other Ambulatory Visit: Payer: Self-pay

## 2023-12-11 DIAGNOSIS — F909 Attention-deficit hyperactivity disorder, unspecified type: Secondary | ICD-10-CM

## 2023-12-11 MED ORDER — AMPHETAMINE-DEXTROAMPHETAMINE 20 MG PO TABS: 20.0000 mg | ORAL_TABLET | Freq: Two times a day (BID) | ORAL | 0 refills | Status: DC

## 2024-01-13 ENCOUNTER — Other Ambulatory Visit: Payer: Self-pay

## 2024-01-13 DIAGNOSIS — F909 Attention-deficit hyperactivity disorder, unspecified type: Secondary | ICD-10-CM

## 2024-01-13 MED ORDER — AMPHETAMINE-DEXTROAMPHETAMINE 20 MG PO TABS: 20.0000 mg | ORAL_TABLET | Freq: Two times a day (BID) | ORAL | 0 refills | Status: DC

## 2024-02-14 ENCOUNTER — Other Ambulatory Visit: Payer: Self-pay

## 2024-02-14 DIAGNOSIS — F909 Attention-deficit hyperactivity disorder, unspecified type: Secondary | ICD-10-CM

## 2024-02-14 MED ORDER — AMPHETAMINE-DEXTROAMPHETAMINE 20 MG PO TABS: 20.0000 mg | ORAL_TABLET | Freq: Two times a day (BID) | ORAL | 0 refills | Status: DC

## 2024-03-05 ENCOUNTER — Ambulatory Visit: Payer: 59 | Admitting: Dermatology

## 2024-03-05 ENCOUNTER — Encounter: Payer: Self-pay | Admitting: Dermatology

## 2024-03-05 DIAGNOSIS — Z1283 Encounter for screening for malignant neoplasm of skin: Secondary | ICD-10-CM

## 2024-03-05 DIAGNOSIS — W908XXA Exposure to other nonionizing radiation, initial encounter: Secondary | ICD-10-CM

## 2024-03-05 DIAGNOSIS — L821 Other seborrheic keratosis: Secondary | ICD-10-CM

## 2024-03-05 DIAGNOSIS — D1801 Hemangioma of skin and subcutaneous tissue: Secondary | ICD-10-CM | POA: Diagnosis not present

## 2024-03-05 DIAGNOSIS — Z86007 Personal history of in-situ neoplasm of skin: Secondary | ICD-10-CM | POA: Diagnosis not present

## 2024-03-05 DIAGNOSIS — D229 Melanocytic nevi, unspecified: Secondary | ICD-10-CM

## 2024-03-05 DIAGNOSIS — L578 Other skin changes due to chronic exposure to nonionizing radiation: Secondary | ICD-10-CM | POA: Diagnosis not present

## 2024-03-05 DIAGNOSIS — L209 Atopic dermatitis, unspecified: Secondary | ICD-10-CM | POA: Diagnosis not present

## 2024-03-05 DIAGNOSIS — L814 Other melanin hyperpigmentation: Secondary | ICD-10-CM

## 2024-03-05 MED ORDER — CLOBETASOL PROPIONATE 0.05 % EX CREA
TOPICAL_CREAM | CUTANEOUS | 2 refills | Status: AC
Start: 2024-03-05 — End: ?

## 2024-03-05 NOTE — Progress Notes (Signed)
 Follow-Up Visit   Subjective  Renee Ayala is a 53 y.o. female who presents for the following: Skin Cancer Screening and Full Body Skin Exam. Hx of dysplastic nevus. No personal Hx of skin cancer.   Rash on left forearm. Dur: 1 month. Elidel  was not helping. Using OTC Calamine lotion for the past week. Seems to be helping with the itch. Does have Hx of atopic dermatitis.   The patient presents for Total-Body Skin Exam (TBSE) for skin cancer screening and mole check. The patient has spots, moles and lesions to be evaluated, some may be new or changing and the patient may have concern these could be cancer.    The following portions of the chart were reviewed this encounter and updated as appropriate: medications, allergies, medical history  Review of Systems:  No other skin or systemic complaints except as noted in HPI or Assessment and Plan.  Objective  Well appearing patient in no apparent distress; mood and affect are within normal limits.  A full examination was performed including scalp, head, eyes, ears, nose, lips, neck, chest, axillae, abdomen, back, buttocks, bilateral upper extremities, bilateral lower extremities, hands, feet, fingers, toes, fingernails, and toenails. All findings within normal limits unless otherwise noted below.   Exam of nails limited by presence of nail polish.   Relevant physical exam findings are noted in the Assessment and Plan.     Assessment & Plan   SKIN CANCER SCREENING PERFORMED TODAY.  HISTORY OF DYSPLASTIC NEVUS. Left buttock. 05/17/2004. No evidence of recurrence today Recommend regular full body skin exams Recommend daily broad spectrum sunscreen SPF 30+ to sun-exposed areas, reapply every 2 hours as needed.  Call if any new or changing lesions are noted between office visits   ACTINIC DAMAGE - Chronic condition, secondary to cumulative UV/sun exposure - diffuse scaly erythematous macules with underlying dyspigmentation -  Recommend daily broad spectrum sunscreen SPF 30+ to sun-exposed areas, reapply every 2 hours as needed.  - Staying in the shade or wearing long sleeves, sun glasses (UVA+UVB protection) and wide brim hats (4-inch brim around the entire circumference of the hat) are also recommended for sun protection.  - Call for new or changing lesions.  LENTIGINES, SEBORRHEIC KERATOSES, HEMANGIOMAS - Benign normal skin lesions - Benign-appearing - Call for any changes  MELANOCYTIC NEVI - Tan-brown and/or pink-flesh-colored symmetric macules and papules - Benign appearing on exam today - Observation - Call clinic for new or changing moles - Recommend daily use of broad spectrum spf 30+ sunscreen to sun-exposed areas.  - Check nails when remove polish.    ATOPIC DERMATITIS Exam: Scaly pink papules coalescing to plaques forearms hands 5% BSA  Chronic and persistent condition with duration or expected duration over one year. Condition is bothersome/symptomatic for patient. Currently flared.   Atopic dermatitis (eczema) is a chronic, relapsing, pruritic condition that can significantly affect quality of life. It is often associated with allergic rhinitis and/or asthma and can require treatment with topical medications, phototherapy, or in severe cases biologic injectable medication (Dupixent; Adbry) or Oral JAK inhibitors.  Treatment Plan: Start Clobetasol  cream twice a day to affected body areas until resolved then as needed. Avoid applying to face, groin, and axilla. Use as directed. Long-term use can cause thinning of the skin.  Topical steroids (such as triamcinolone , fluocinolone, fluocinonide, mometasone, clobetasol , halobetasol , betamethasone , hydrocortisone ) can cause thinning and lightening of the skin if they are used for too long in the same area. Your physician has selected the  right strength medicine for your problem and area affected on the body. Please use your medication only as directed by  your physician to prevent side effects.    Recommend gentle skin care.    MULTIPLE BENIGN NEVI   LENTIGINES   ACTINIC ELASTOSIS   SEBORRHEIC KERATOSES   CHERRY ANGIOMA   ATOPIC DERMATITIS, UNSPECIFIED TYPE   Return in about 1 year (around 03/05/2025) for TBSE, HxDN.  I, Jill Parcell, CMA, am acting as scribe for Boneta Sharps, MD.   Documentation: I have reviewed the above documentation for accuracy and completeness, and I agree with the above.  Boneta Sharps, MD

## 2024-03-05 NOTE — Patient Instructions (Addendum)
 Start Clobetasol  cream twice a day to affected body areas until rash has resolved then as needed. Avoid applying to face, groin, and axilla. Use as directed. Long-term use can cause thinning of the skin.  Topical steroids (such as triamcinolone , fluocinolone, fluocinonide, mometasone, clobetasol , halobetasol , betamethasone , hydrocortisone ) can cause thinning and lightening of the skin if they are used for too long in the same area. Your physician has selected the right strength medicine for your problem and area affected on the body. Please use your medication only as directed by your physician to prevent side effects.     Recommend daily broad spectrum sunscreen SPF 30+ to sun-exposed areas, reapply every 2 hours as needed. Call for new or changing lesions.  Staying in the shade or wearing long sleeves, sun glasses (UVA+UVB protection) and wide brim hats (4-inch brim around the entire circumference of the hat) are also recommended for sun protection.      Melanoma ABCDEs  Melanoma is the most dangerous type of skin cancer, and is the leading cause of death from skin disease.  You are more likely to develop melanoma if you: Have light-colored skin, light-colored eyes, or red or blond hair Spend a lot of time in the sun Tan regularly, either outdoors or in a tanning bed Have had blistering sunburns, especially during childhood Have a close family member who has had a melanoma Have atypical moles or large birthmarks  Early detection of melanoma is key since treatment is typically straightforward and cure rates are extremely high if we catch it early.   The first sign of melanoma is often a change in a mole or a new dark spot.  The ABCDE system is a way of remembering the signs of melanoma.  A for asymmetry:  The two halves do not match. B for border:  The edges of the growth are irregular. C for color:  A mixture of colors are present instead of an even brown color. D for diameter:   Melanomas are usually (but not always) greater than 6mm - the size of a pencil eraser. E for evolution:  The spot keeps changing in size, shape, and color.  Please check your skin once per month between visits. You can use a small mirror in front and a large mirror behind you to keep an eye on the back side or your body.   If you see any new or changing lesions before your next follow-up, please call to schedule a visit.  Please continue daily skin protection including broad spectrum sunscreen SPF 30+ to sun-exposed areas, reapplying every 2 hours as needed when you're outdoors.   Staying in the shade or wearing long sleeves, sun glasses (UVA+UVB protection) and wide brim hats (4-inch brim around the entire circumference of the hat) are also recommended for sun protection.      Due to recent changes in healthcare laws, you may see results of your pathology and/or laboratory studies on MyChart before the doctors have had a chance to review them. We understand that in some cases there may be results that are confusing or concerning to you. Please understand that not all results are received at the same time and often the doctors may need to interpret multiple results in order to provide you with the best plan of care or course of treatment. Therefore, we ask that you please give us  2 business days to thoroughly review all your results before contacting the office for clarification. Should we see a critical lab result,  you will be contacted sooner.   If You Need Anything After Your Visit  If you have any questions or concerns for your doctor, please call our main line at [phone] and press option 4 to reach your doctor's medical assistant. If no one answers, please leave a voicemail as directed and we will return your call as soon as possible. Messages left after 4 pm will be answered the following business day.   You may also send us  a message via MyChart. We typically respond to MyChart  messages within 1-2 business days.  For prescription refills, please ask your pharmacy to contact our office. Our fax number is [phone].  If you have an urgent issue when the clinic is closed that cannot wait until the next business day, you can page your doctor at the number below.    Please note that while we do our best to be available for urgent issues outside of office hours, we are not available 24/7.   If you have an urgent issue and are unable to reach us , you may choose to seek medical care at your doctor's office, retail clinic, urgent care center, or emergency room.  If you have a medical emergency, please immediately call 911 or go to the emergency department.  Pager Numbers  - Dr. Hester: [phone]  - Dr. Jackquline: [phone]  - Dr. Claudene: [phone]   - Dr. Raymund: [phone]  In the event of inclement weather, please call our main line at [phone] for an update on the status of any delays or closures.  Dermatology Medication Tips: Please keep the boxes that topical medications come in in order to help keep track of the instructions about where and how to use these. Pharmacies typically print the medication instructions only on the boxes and not directly on the medication tubes.   If your medication is too expensive, please contact our office at [phone] option 4 or send us  a message through MyChart.   We are unable to tell what your co-pay for medications will be in advance as this is different depending on your insurance coverage. However, we may be able to find a substitute medication at lower cost or fill out paperwork to get insurance to cover a needed medication.   If a prior authorization is required to get your medication covered by your insurance company, please allow us  1-2 business days to complete this process.  Drug prices often vary depending on where the prescription is filled and some pharmacies may offer cheaper prices.  The  website www.goodrx.com contains coupons for medications through different pharmacies. The prices here do not account for what the cost may be with help from insurance (it may be cheaper with your insurance), but the website can give you the price if you did not use any insurance.  - You can print the associated coupon and take it with your prescription to the pharmacy.  - You may also stop by our office during regular business hours and pick up a GoodRx coupon card.  - If you need your prescription sent electronically to a different pharmacy, notify our office through [facility] or by phone at [phone] option 4.     Si Usted Necesita Algo Despus de Su Visita  Tambin puede enviarnos un mensaje a travs de Clinical Cytogeneticist. Por lo general respondemos a los mensajes de MyChart en el transcurso de 1 a 2 das hbiles.  Para renovar recetas, por favor pida a su farmacia que se ponga en  contacto con nuestra oficina. Randi [place] de fax es [place] [phone].  Si tiene un asunto urgente cuando la clnica est cerrada y que no puede esperar hasta el siguiente da hbil, puede llamar/localizar a su doctor(a) al nmero que aparece a continuacin.   Por favor, tenga en cuenta que aunque hacemos todo lo posible para estar disponibles para asuntos urgentes fuera del horario de [place], no estamos disponibles las 24 horas del da, los 7 [address]  [address] de la [place].   Si tiene un problema urgente y no puede comunicarse con nosotros, puede optar por buscar atencin mdica  en el consultorio de su doctor(a), en una clnica privada, en un centro de atencin urgente o en una sala de emergencias.  Si tiene engineer, drilling, por favor llame inmediatamente al 911 o vaya a la sala de emergencias.  Nmeros de bper  - Dr. Hester: [phone]  - Dra. Jackquline: [phone]  - Dr. Claudene: [phone]  - Dra. Kitts: [phone]  En caso de inclemencias del [place], por favor llame a nuestra lnea principal al  [phone] para una actualizacin sobre el estado de cualquier retraso o cierre.  Consejos para la medicacin en dermatologa: Por favor, guarde las cajas en las que vienen los medicamentos de uso tpico para ayudarle a seguir las instrucciones sobre dnde y cmo usarlos. Las farmacias generalmente imprimen las instrucciones del medicamento slo en las cajas y no directamente en los tubos del [place].   Si su medicamento es muy caro, por favor, pngase en contacto con landry rieger llamando al [phone] y presione la opcin 4 o envenos un mensaje a travs de Clinical Cytogeneticist.   No podemos decirle cul ser su copago por los medicamentos por adelantado ya que esto es diferente dependiendo de la cobertura de su seguro. Sin embargo, es posible que podamos encontrar un medicamento sustituto a audiological scientist un formulario para que el seguro cubra el medicamento que se considera necesario.   Si se requiere una autorizacin previa para que su compaa de seguros cubra su medicamento, por favor permtanos de 1 a 2 das hbiles para completar este proceso.  Los precios de los medicamentos varan con frecuencia dependiendo del environmental consultant de dnde se surte la receta y alguna farmacias pueden ofrecer precios ms baratos.  El sitio web www.goodrx.com tiene cupones para medicamentos de health and safety inspector. Los precios aqu no tienen en cuenta lo que podra costar con la ayuda del seguro (puede ser ms barato con su seguro), pero el sitio web puede darle el precio si no utiliz tourist information centre manager.  - Puede imprimir el cupn correspondiente y llevarlo con su receta a la farmacia.  - Tambin puede pasar por nuestra oficina durante el horario de atencin regular y education officer, museum una tarjeta de cupones de GoodRx.  - Si necesita que su receta se enve electrnicamente a una farmacia diferente, informe a nuestra oficina a travs de MyChart de [place] o por telfono llamando al [phone] y presione la opcin 4.

## 2024-03-16 ENCOUNTER — Other Ambulatory Visit: Payer: Self-pay

## 2024-03-16 DIAGNOSIS — F909 Attention-deficit hyperactivity disorder, unspecified type: Secondary | ICD-10-CM

## 2024-03-16 MED ORDER — AMPHETAMINE-DEXTROAMPHETAMINE 20 MG PO TABS: 20.0000 mg | ORAL_TABLET | Freq: Two times a day (BID) | ORAL | 0 refills | Status: DC

## 2024-04-20 DIAGNOSIS — F909 Attention-deficit hyperactivity disorder, unspecified type: Secondary | ICD-10-CM

## 2024-04-20 MED ORDER — AMPHETAMINE-DEXTROAMPHETAMINE 20 MG PO TABS: 20.0000 mg | ORAL_TABLET | Freq: Two times a day (BID) | ORAL | 0 refills | Status: DC

## 2024-05-21 ENCOUNTER — Encounter: Payer: Self-pay | Admitting: Physician Assistant

## 2024-05-21 ENCOUNTER — Ambulatory Visit: Payer: Self-pay | Admitting: Physician Assistant

## 2024-05-21 VITALS — BP 136/78 | HR 93 | Resp 16 | Ht 62.0 in | Wt 200.0 lb

## 2024-05-21 DIAGNOSIS — Z7689 Persons encountering health services in other specified circumstances: Secondary | ICD-10-CM

## 2024-05-21 MED ORDER — TIRZEPATIDE-WEIGHT MANAGEMENT 2.5 MG/0.5ML ~~LOC~~ SOLN: 2.5000 mg | SUBCUTANEOUS | 0 refills | Status: AC

## 2024-05-21 NOTE — Progress Notes (Signed)
 Pt presents today requesting to start Zepbound .

## 2024-05-21 NOTE — Progress Notes (Signed)
" ° °  Subjective: Weight management    Patient ID: Renee Ayala, female    DOB: 23-Feb-1971, 54 y.o.   MRN: [id number]  HPI Patient requests Zepbound  for weight management.  Patient has used phentermine  in the past with only mild results.   Review of Systems ADHD and hypertension.    Objective:   Physical Exam BP 136/78  Cuff Size Large  Pulse Rate 93  Weight 200 lb (90.7 kg)  Height 5' 2 (1.575 m)  Resp 16  SpO2 97 %   36.58 kg/m2   BSA: 1.99 m2  The remainder of the physical exam was deferred.       Assessment & Plan: Weight management.   Prescription for Zepbound  was sent to pharmacy.  Patient will follow-up in 1 month.  k "

## 2024-05-22 ENCOUNTER — Other Ambulatory Visit: Payer: Self-pay

## 2024-05-22 DIAGNOSIS — F909 Attention-deficit hyperactivity disorder, unspecified type: Secondary | ICD-10-CM

## 2024-05-22 MED ORDER — AMPHETAMINE-DEXTROAMPHETAMINE 20 MG PO TABS: 20.0000 mg | ORAL_TABLET | Freq: Two times a day (BID) | ORAL | 0 refills | Status: AC

## 2025-03-08 ENCOUNTER — Ambulatory Visit: Admitting: Dermatology
